# Patient Record
Sex: Female | Born: 1995 | Hispanic: Yes | Marital: Married | Smoking: Former smoker
Health system: Southern US, Community
[De-identification: ages and names within clinical notes are randomized; demographics above are authoritative.]

## PROBLEM LIST (undated history)

## (undated) DIAGNOSIS — F32A Depression, unspecified: Secondary | ICD-10-CM

## (undated) DIAGNOSIS — J45909 Unspecified asthma, uncomplicated: Secondary | ICD-10-CM

## (undated) DIAGNOSIS — F329 Major depressive disorder, single episode, unspecified: Secondary | ICD-10-CM

## (undated) HISTORY — DX: Depression, unspecified: F32.A

## (undated) HISTORY — DX: Major depressive disorder, single episode, unspecified: F32.9

## (undated) HISTORY — PX: APPENDECTOMY: SHX54

## (undated) HISTORY — DX: Unspecified asthma, uncomplicated: J45.909

---
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
## 2017-06-26 ENCOUNTER — Emergency Department
Admission: EM | Admit: 2017-06-26 | Discharge: 2017-06-27 | Disposition: A | Discharge status: Home / Self Care | Payer: Self-pay | Attending: Emergency Medicine | Admitting: Emergency Medicine

## 2017-06-26 DIAGNOSIS — E876 Hypokalemia: Secondary | ICD-10-CM

## 2017-06-26 DIAGNOSIS — O209 Hemorrhage in early pregnancy, unspecified: Secondary | ICD-10-CM | POA: Insufficient documentation

## 2017-06-26 DIAGNOSIS — Z3490 Encounter for supervision of normal pregnancy, unspecified, unspecified trimester: Secondary | ICD-10-CM

## 2017-06-26 DIAGNOSIS — O99281 Endocrine, nutritional and metabolic diseases complicating pregnancy, first trimester: Secondary | ICD-10-CM | POA: Insufficient documentation

## 2017-06-26 DIAGNOSIS — O200 Threatened abortion: Secondary | ICD-10-CM | POA: Insufficient documentation

## 2017-06-26 DIAGNOSIS — Z79899 Other long term (current) drug therapy: Secondary | ICD-10-CM | POA: Insufficient documentation

## 2017-06-26 DIAGNOSIS — Z3A01 Less than 8 weeks gestation of pregnancy: Secondary | ICD-10-CM | POA: Insufficient documentation

## 2017-06-26 LAB — URINALYSIS, ROUTINE W REFLEX MICROSCOPIC
BILIRUBIN URINE: NEGATIVE
GLUCOSE, UA: NEGATIVE mg/dL
KETONES UR: NEGATIVE mg/dL
LEUKOCYTES UA: NEGATIVE
Nitrite: NEGATIVE
PH: 5 (ref 5.0–8.0)
PROTEIN: 30 mg/dL — AB
Specific Gravity, Urine: 1.028 (ref 1.005–1.030)

## 2017-06-26 LAB — BASIC METABOLIC PANEL
Anion gap: 9 (ref 5–15)
BUN: 10 mg/dL (ref 6–20)
CALCIUM: 9.3 mg/dL (ref 8.9–10.3)
CHLORIDE: 103 mmol/L (ref 101–111)
CO2: 22 mmol/L (ref 22–32)
CREATININE: 0.58 mg/dL (ref 0.44–1.00)
GFR calc non Af Amer: >60 mL/min (ref >60)
Glucose, Bld: 107 mg/dL — ABNORMAL HIGH (ref 65–99)
Potassium: 3.1 mmol/L — ABNORMAL LOW (ref 3.5–5.1)
SODIUM: 134 mmol/L — AB (ref 135–145)

## 2017-06-26 LAB — CBC WITH DIFFERENTIAL/PLATELET
BASOS ABS: 0 10*3/uL (ref 0–0.1)
Basophils Relative: 1 %
EOS ABS: 0 10*3/uL (ref 0–0.7)
EOS PCT: 1 %
HEMATOCRIT: 37.3 % (ref 35.0–47.0)
Hemoglobin: 12.3 g/dL (ref 12.0–16.0)
Lymphocytes Relative: 33 %
Lymphs Abs: 1.7 10*3/uL (ref 1.0–3.6)
MCH: 29.1 pg (ref 26.0–34.0)
MCHC: 33.1 g/dL (ref 32.0–36.0)
MCV: 87.8 fL (ref 80.0–100.0)
MONO ABS: 0.4 10*3/uL (ref 0.2–0.9)
MONOS PCT: 7 %
Neutro Abs: 3 10*3/uL (ref 1.4–6.5)
Neutrophils Relative %: 58 %
PLATELETS: 232 10*3/uL (ref 150–440)
RBC: 4.24 MIL/uL (ref 3.80–5.20)
RDW: 15 % — AB (ref 11.5–14.5)
WBC: 5.1 10*3/uL (ref 3.6–11.0)

## 2017-06-26 LAB — HCG, QUANTITATIVE, PREGNANCY: HCG, BETA CHAIN, QUANT, S: 24875 m[IU]/mL — AB (ref <5)

## 2017-06-26 LAB — ABO/RH: ABO/RH(D): O POS

## 2017-06-26 LAB — PREGNANCY, URINE: PREG TEST UR: POSITIVE — AB

## 2017-06-26 MED ORDER — SODIUM CHLORIDE 0.9 % IV BOLUS (SEPSIS): 500.0000 mL | INTRAVENOUS | Status: DC

## 2017-06-26 NOTE — ED Triage Notes (Signed)
Patient c/o vaginal bleeding during pregnancy, lower left abdominal pain, and syncope.  Patient reports hx of 1 previous miscarriage. Patient is tearful during triage.

## 2017-06-26 NOTE — ED Provider Notes (Signed)
[facility] Emergency Department Provider Note  ____________________________________________   First MD Initiated Contact with Patient 06/26/17 2325     (approximate)  I have reviewed the triage vital signs and the nursing notes.   HISTORY  Chief Complaint Vaginal Bleeding and Loss of Consciousness    HPI Felicia Scott is a 21 y.o. female G2P0 (1 prior spontaneous abortion about 3 years ago) at about [redacted] weeks gestation by LMP who presents for evaluation of vaginal bleeding today with some generalized but primarily left sided abdominal pain and one episode of syncope while she was at work.  She says that for at least a week she has been having a light vaginal spotting but did not think anything of it.  She did not have anything to eat or much to drink today at work so she also was particularly concerned when she had her syncopal episode and was sent home.  However after she was home she went to the bathroom and there was a larger amount of blood than usual with what she believes was a clot.  She is also going through a lot of social stress and the increased amount of blood scared her so she came into the emergency department for evaluation.  She does not feel lightheaded or dizzy at this time and she denies fever/chills, chest pain, shortness of breath, nausea, vomiting, and dysuria.  Overall she describes her symptoms as severe but she is not actively bleeding now, early she does not believe she is doing so, and her abdominal pain comes and goes but is relatively mild and after the initial description of left lower quadrant abdominal pain she states is actually up under her ribs.  Nothing in particular makes her symptoms better nor worse.  History reviewed. No pertinent past medical history.  Patient Active Problem List   Diagnosis Date Noted  . MDD (major depressive disorder), single episode, severe (HCC) 07/01/2015  . Depression 06/28/2015    Past Surgical  History:  Procedure Laterality Date  . APPENDECTOMY      Prior to Admission medications   Medication Sig Start Date End Date Taking? Authorizing Provider  potassium chloride SA (KLOR-CON M20) 20 MEQ tablet Take 1 tablet (20 mEq total) by mouth daily. 06/27/17   Loleta Rose, MD  sertraline (ZOLOFT) 50 MG tablet Take 1 tablet (50 mg total) by mouth daily. 07/04/15   Truman Hayward, FNP    Allergies Patient has no known allergies.  No family history on file.  Social History Social History   Tobacco Use  . Smoking status: Never Smoker  . Smokeless tobacco: Never Used  Substance Use Topics  . Alcohol use: Yes  . Drug use: Yes    Types: Marijuana    Review of Systems Constitutional: No fever/chills Cardiovascular: Denies chest pain. Respiratory: Denies shortness of breath. Gastrointestinal: Vague generalized abdominal pain, worse in the left lower quadrant.  No nausea, no vomiting.  No diarrhea.  No constipation. Genitourinary: Vaginal bleeding during early pregnancy, light spotting for about a week with slightly heavier bleeding today Musculoskeletal: Negative for neck pain.  Negative for back pain. Integumentary: Negative for rash. Neurological: Negative for headaches, focal weakness or numbness.   ____________________________________________   PHYSICAL EXAM:  VITAL SIGNS: ED Triage Vitals  Enc Vitals Group     BP 06/26/17 2254 (!) 142/80     Pulse Rate 06/26/17 2254 (!) 104     Resp 06/26/17 2254 19     Temp  06/26/17 2254 98.3 F (36.8 C)     Temp Source 06/26/17 2254 Oral     SpO2 06/26/17 2254 100 %     Weight 06/26/17 2255 78.9 kg (174 lb)     Height --      Head Circumference --      Peak Flow --      Pain Score 06/26/17 2254 8     Pain Loc --      Pain Edu? --      Excl. in GC? --     Constitutional: Alert and oriented. Well appearing and in no acute distress except for being slightly tearful and upset Eyes: Conjunctivae are normal.    Cardiovascular: Normal rate, regular rhythm. Good peripheral circulation. Grossly normal heart sounds. Respiratory: Normal respiratory effort.  No retractions. Lungs CTAB. Gastrointestinal: Soft with some mild tenderness to palpation of the suprapubic region and left lower quadrant but no peritonitis and no rebound. Genitourinary: deferred - no compelling indication for pelvic exam tonight given single IUP with no gross abnormalities on U/S and no persistent bleeding Musculoskeletal: No lower extremity tenderness nor edema. No gross deformities of extremities. Neurologic:  Normal speech and language. No gross focal neurologic deficits are appreciated.  Skin:  Skin is warm, dry and intact. No rash noted. Psychiatric: Mood and affect are normal under the circumstances, slightly tearful but generally appropriate  ____________________________________________   LABS (all labs ordered are listed, but only abnormal results are displayed)  Labs Reviewed  URINALYSIS, ROUTINE W REFLEX MICROSCOPIC - Abnormal; Notable for the following components:      Result Value   Color, Urine YELLOW (*)    APPearance HAZY (*)    Hgb urine dipstick LARGE (*)    Protein, ur 30 (*)    Bacteria, UA RARE (*)    Squamous Epithelial / LPF 0-5 (*)    All other components within normal limits  CBC WITH DIFFERENTIAL/PLATELET - Abnormal; Notable for the following components:   RDW 15.0 (*)    All other components within normal limits  BASIC METABOLIC PANEL - Abnormal; Notable for the following components:   Sodium 134 (*)    Potassium 3.1 (*)    Glucose, Bld 107 (*)    All other components within normal limits  HCG, QUANTITATIVE, PREGNANCY - Abnormal; Notable for the following components:   hCG, Beta Chain, Quant, S 24,875 (*)    All other components within normal limits  PREGNANCY, URINE - Abnormal; Notable for the following components:   Preg Test, Ur POSITIVE (*)    All other components within normal limits   ABO/RH   ____________________________________________  EKG  ED ECG REPORT I, Loleta Roseory Tykisha Areola, the attending physician, personally viewed and interpreted this ECG.  Date: 06/26/2017 EKG Time: 22:44 Rate: 94 Rhythm: normal sinus rhythm with sinus arrhythmia QRS Axis: normal Intervals: normal ST/T Wave abnormalities: normal Narrative Interpretation: no evidence of acute ischemia  ____________________________________________  RADIOLOGY   [place] Ob Comp < 14 Wks  Result Date: 06/27/2017 CLINICAL DATA:  Vaginal bleeding, cramping. Gestational age by last menstrual period 6 weeks. EXAM: OBSTETRIC <14 WK US AND TRANSVAGINAL OB US TECHNIQUE: Both transabdominal and transvaginal ultrasound examinations were performed for complete evaluation of the gestation as well as the maternal uterus, adnexal regions, and pelvic cul-de-sac. Transvaginal technique was performed to assess early pregnancy. COMPARISON:  None. FINDINGS: Intrauterine gestational sac: Present Yolk sac:  Present Embryo:  Present Cardiac Activity: Present Heart Rate: 87  bpm CRL:  3  mm   6 w   0 d                  [facility] Subchorionic hemorrhage:  None visualized. Maternal uterus/adnexae: Normal appearance of the adnexae. Trace free fluid in the pelvis. IMPRESSION: Single live intrauterine pregnancy, gestational age by ultrasound 6 weeks. No immediate complication. Electronically Signed   By: Awilda Metroourtnay  Bloomer M.D.   On: 06/27/2017 01:24   [place] Ob Transvaginal  Result Date: 06/27/2017 CLINICAL DATA:  Vaginal bleeding, cramping. Gestational age by last menstrual period 6 weeks. EXAM: OBSTETRIC <14 WK US AND TRANSVAGINAL OB US TECHNIQUE: Both transabdominal and transvaginal ultrasound examinations were performed for complete evaluation of the gestation as well as the maternal uterus, adnexal regions, and pelvic cul-de-sac. Transvaginal technique was performed to assess early pregnancy. COMPARISON:  None. FINDINGS: Intrauterine  gestational sac: Present Yolk sac:  Present Embryo:  Present Cardiac Activity: Present Heart Rate: 87  bpm CRL:  3  mm   6 w   0 d                  US EDC: February 20, 2018 Subchorionic hemorrhage:  None visualized. Maternal uterus/adnexae: Normal appearance of the adnexae. Trace free fluid in the pelvis. IMPRESSION: Single live intrauterine pregnancy, gestational age by ultrasound 6 weeks. No immediate complication. Electronically Signed   By: Awilda Metroourtnay  Bloomer M.D.   On: 06/27/2017 01:24    ____________________________________________   PROCEDURES  Critical Care performed: No   Procedure(s) performed:   Procedures   ____________________________________________   INITIAL IMPRESSION / ASSESSMENT AND PLAN / ED COURSE  As part of my medical decision making, I reviewed the following data within the electronic MEDICAL RECORD NUMBER Nursing notes reviewed and incorporated, Labs reviewed  and EKG interpreted     Differential diagnosis includes, but is not limited to, threatened miscarriage, incomplete miscarriage, normal bleeding from an early trimester pregnancy, ectopic pregnancy, , blighted ovum, vaginal/cervical trauma, subchorionic hemorrhage/hematoma, etc.  Labs pending.  U-preg positive, discussed with ultrasound, they will proceed.  About [redacted] weeks pregnant by dates.     Clinical Course as of Jun 27 199  Fri Jun 27, 2017  0013 HCG, Newman NickelsBeta Chain, Quant, S: (!) [phone] [CF]  [id number] Reassuring U/S with 6-wk single IUP, no acute abnormalities.  Will update patient. US OB Comp < 14 Wks [CF]  0153 Oh positive blood, no indication for RhoGam.  Reassuring ultrasound, I had my usual and customary threatened miscarriage discussion with the patient and encourage close outpatient follow-up.  Low potassium, I will provide a supplement tonight and I encouraged good oral intake at home.  No indication for IV fluids at this time.  I answered any questions that the patient had and [facility] and actually already has an appointment.  I gave my usual and customary return precautions.  [CF]    Clinical Course User Index [CF] Loleta RoseForbach, Janelly Switalski, MD    ____________________________________________  FINAL CLINICAL IMPRESSION(S) / ED DIAGNOSES  Final diagnoses:  Threatened miscarriage  Pregnancy at early stage  Hypokalemia     MEDICATIONS GIVEN DURING THIS VISIT:  Medications  potassium chloride SA (K-DUR,KLOR-CON) CR tablet 40 mEq (not administered)     ED Discharge Orders        Ordered    potassium chloride SA (KLOR-CON M20) 20 MEQ tablet  Daily     06/27/17 0156       Note:  This  document was prepared using Conservation officer, historic buildings and may include unintentional dictation errors.    Loleta Rose, MD 06/27/17 [phone]

## 2017-06-27 ENCOUNTER — Other Ambulatory Visit: Payer: Self-pay

## 2017-06-27 ENCOUNTER — Emergency Department: Payer: Self-pay

## 2017-06-27 MED ORDER — POTASSIUM CHLORIDE CRYS ER 20 MEQ PO TBCR
40.0000 meq | EXTENDED_RELEASE_TABLET | Freq: Once | ORAL | Status: AC
  Administered 2017-06-27: 40 meq via ORAL
  Filled 2017-06-27: qty 2

## 2017-06-27 MED ORDER — POTASSIUM CHLORIDE CRYS ER 20 MEQ PO TBCR: 20.0000 meq | EXTENDED_RELEASE_TABLET | Freq: Every day | ORAL | 0 refills | Status: DC

## 2017-06-27 NOTE — Discharge Instructions (Signed)
You have been seen in the Emergency Department (ED) for vaginal bleeding during pregnancy, which is called a ?threatened abortion?.  Fortunately, your evaluation was reassuring, and the ultrasound showed a normal pregnancy.  Please start taking prenatal vitamins (over the counter) if you are not already doing so.  As a result of your blood type, you did not receive an injection of medication called Rhogam - please let your OB/Gyn know. ° °Please follow up as recommended above. ° °If you develop any other symptoms that concern you (including, but not limited to, persistent vomiting, worsening bleeding, abdominal or pelvic pain, or fever greater than 101), please return immediately to the Emergency Department. ° °

## 2017-06-27 NOTE — ED Notes (Signed)
Patient transported to Ultrasound 

## 2017-06-27 NOTE — ED Notes (Signed)

## 2017-06-27 NOTE — ED Notes (Signed)
ED Provider at bedside. 

## 2018-03-29 ENCOUNTER — Emergency Department: Payer: Medicaid Other

## 2018-03-29 ENCOUNTER — Emergency Department
Admission: EM | Admit: 2018-03-29 | Discharge: 2018-03-29 | Disposition: A | Discharge status: Home / Self Care | Payer: Medicaid Other | Attending: Emergency Medicine | Admitting: Emergency Medicine

## 2018-03-29 ENCOUNTER — Encounter: Payer: Self-pay | Admitting: Emergency Medicine

## 2018-03-29 DIAGNOSIS — N939 Abnormal uterine and vaginal bleeding, unspecified: Secondary | ICD-10-CM | POA: Diagnosis present

## 2018-03-29 DIAGNOSIS — Z3A01 Less than 8 weeks gestation of pregnancy: Secondary | ICD-10-CM | POA: Insufficient documentation

## 2018-03-29 DIAGNOSIS — Z79899 Other long term (current) drug therapy: Secondary | ICD-10-CM | POA: Diagnosis not present

## 2018-03-29 DIAGNOSIS — O26899 Other specified pregnancy related conditions, unspecified trimester: Secondary | ICD-10-CM

## 2018-03-29 DIAGNOSIS — O200 Threatened abortion: Secondary | ICD-10-CM | POA: Diagnosis not present

## 2018-03-29 DIAGNOSIS — R109 Unspecified abdominal pain: Secondary | ICD-10-CM

## 2018-03-29 LAB — WET PREP, GENITAL
Clue Cells Wet Prep HPF POC: NONE SEEN
Sperm: NONE SEEN
Trich, Wet Prep: NONE SEEN
WBC WET PREP: NONE SEEN
YEAST WET PREP: NONE SEEN

## 2018-03-29 LAB — CBC WITH DIFFERENTIAL/PLATELET
BASOS PCT: 1 %
Basophils Absolute: 0.1 10*3/uL (ref 0–0.1)
EOS ABS: 0 10*3/uL (ref 0–0.7)
EOS PCT: 0 %
HEMATOCRIT: 37.1 % (ref 35.0–47.0)
Hemoglobin: 12.7 g/dL (ref 12.0–16.0)
Lymphocytes Relative: 19 %
Lymphs Abs: 1.2 10*3/uL (ref 1.0–3.6)
MCH: 29.8 pg (ref 26.0–34.0)
MCHC: 34.3 g/dL (ref 32.0–36.0)
MCV: 87.1 fL (ref 80.0–100.0)
MONO ABS: 0.7 10*3/uL (ref 0.2–0.9)
MONOS PCT: 11 %
NEUTROS ABS: 4.6 10*3/uL (ref 1.4–6.5)
Neutrophils Relative %: 69 %
PLATELETS: 221 10*3/uL (ref 150–440)
RBC: 4.26 MIL/uL (ref 3.80–5.20)
RDW: 14.4 % (ref 11.5–14.5)
WBC: 6.7 10*3/uL (ref 3.6–11.0)

## 2018-03-29 LAB — PREGNANCY, URINE: PREG TEST UR: POSITIVE — AB

## 2018-03-29 LAB — URINALYSIS, COMPLETE (UACMP) WITH MICROSCOPIC
BACTERIA UA: NONE SEEN
BILIRUBIN URINE: NEGATIVE
Glucose, UA: NEGATIVE mg/dL
Ketones, ur: NEGATIVE mg/dL
Leukocytes, UA: NEGATIVE
NITRITE: NEGATIVE
PROTEIN: NEGATIVE mg/dL
Specific Gravity, Urine: 1.011 (ref 1.005–1.030)
pH: 5 (ref 5.0–8.0)

## 2018-03-29 LAB — COMPREHENSIVE METABOLIC PANEL
ALBUMIN: 4.5 g/dL (ref 3.5–5.0)
ALT: 12 U/L (ref 0–44)
ANION GAP: 8 (ref 5–15)
AST: 21 U/L (ref 15–41)
Alkaline Phosphatase: 68 U/L (ref 38–126)
BUN: 13 mg/dL (ref 6–20)
CHLORIDE: 105 mmol/L (ref 98–111)
CO2: 25 mmol/L (ref 22–32)
Calcium: 9.5 mg/dL (ref 8.9–10.3)
Creatinine, Ser: 0.82 mg/dL (ref 0.44–1.00)
GFR calc Af Amer: >60 mL/min (ref >60)
Glucose, Bld: 100 mg/dL — ABNORMAL HIGH (ref 70–99)
Potassium: 3.4 mmol/L — ABNORMAL LOW (ref 3.5–5.1)
Sodium: 138 mmol/L (ref 135–145)
Total Bilirubin: 0.8 mg/dL (ref 0.3–1.2)
Total Protein: 7.8 g/dL (ref 6.5–8.1)

## 2018-03-29 LAB — CHLAMYDIA/NGC RT PCR (ARMC ONLY)
Chlamydia Tr: NOT DETECTED
N GONORRHOEAE: NOT DETECTED

## 2018-03-29 LAB — POCT PREGNANCY, URINE: PREG TEST UR: POSITIVE — AB

## 2018-03-29 LAB — SAMPLE TO BLOOD BANK

## 2018-03-29 LAB — HCG, QUANTITATIVE, PREGNANCY: hCG, Beta Chain, Quant, S: 24501 m[IU]/mL — ABNORMAL HIGH (ref <5)

## 2018-03-29 MED ORDER — PROMETHAZINE HCL 25 MG/ML IJ SOLN
12.5000 mg | Freq: Once | INTRAMUSCULAR | Status: AC
Start: 2018-03-29 — End: 2018-03-29
  Administered 2018-03-29: 12.5 mg via INTRAVENOUS
  Filled 2018-03-29: qty 1

## 2018-03-29 NOTE — ED Notes (Signed)
Patient back from ultrasound

## 2018-03-29 NOTE — ED Notes (Signed)
Pt had IV 20G to right AC that was removed prior to discharge.

## 2018-03-29 NOTE — ED Notes (Signed)
Pt to ultrasound

## 2018-03-29 NOTE — Discharge Instructions (Addendum)
Anytime there is bleeding in pregnancy we have a threatened miscarriage.  You have a little bit of bleeding around the pregnancy called a subchorionic hemorrhage.  There is a very good chance that this will go away and the pregnancy will turn out normally but that is not guaranteed.  Please remember that this early in pregnancy.  If you do miscarry at something wrong with the pregnancy nothing that you were your husband did.  Please follow-up with Dr. Valentino Saxon the OB/GYN doctor.  Please call them Tuesday morning they should be able to get you in and do a repeat blood test to check the pregnancy hormone level and be able to go from there.  Please return here for worse cramping bleeding or pain.  Also return here for any fever.  He does also have them check your blood pressure was very high when you got here.

## 2018-03-29 NOTE — ED Notes (Signed)
Lab notified to run UA from urine already in the lab.

## 2018-03-29 NOTE — ED Provider Notes (Signed)
[facility] Emergency Department Provider Note   ____________________________________________   First MD Initiated Contact with Patient 03/29/18 [phone]     (approximate)  I have reviewed the triage vital signs and the nursing notes.   HISTORY  Chief Complaint Vaginal Bleeding    HP Felicia Scott is a 22 y.o. female patient reports vaginal bleeding and some cramping for about 3 days.  Stopped and she started having some spotting with clots now.  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  History reviewed. No pertinent past medical history.  Patient Active Problem List   Diagnosis Date Noted  . MDD (major depressive disorder), single episode, severe (HCC) 07/01/2015  . Depression 06/28/2015    Past Surgical History:  Procedure Laterality Date  . APPENDECTOMY      Prior to Admission medications   Medication Sig Start Date End Date Taking? Authorizing Provider  sertraline (ZOLOFT) 50 MG tablet Take 1 tablet (50 mg total) by mouth daily. 07/04/15  Yes Starkes, Juel Burrow, FNP  potassium chloride SA (KLOR-CON M20) 20 MEQ tablet Take 1 tablet (20 mEq total) by mouth daily. Patient not taking: Reported on 03/29/2018 06/27/17   Loleta Rose, MD    Allergies Patient has no known allergies.  History reviewed. No pertinent family history.  Social History Social History   Tobacco Use  . Smoking status: Never Smoker  . Smokeless tobacco: Never Used  Substance Use Topics  . Alcohol use: Yes  . Drug use: Yes    Types: Marijuana    Review of Systems  Constitutional: No fever/chills Eyes: No visual changes. ENT: No sore throat. Cardiovascular: Denies chest pain. Respiratory: Denies shortness of breath. Gastrointestinal: Some crampy lower abdominal pain.  No nausea, no vomiting.  No diarrhea.  No constipation. Genitourinary: Negative for dysuria. Musculoskeletal: Negative for back pain. Skin: Negative for rash. Neurological: Negative for headaches,  focal weakness ____________________________________________   PHYSICAL EXAM:  VITAL SIGNS: ED Triage Vitals [03/29/18 0907]  Enc Vitals Group     BP (!) 179/75     Pulse Rate 79     Resp 12     Temp 99 F (37.2 C)     Temp Source Oral     SpO2 100 %     Weight 177 lb (80.3 kg)     Height      Head Circumference      Peak Flow      Pain Score 0     Pain Loc      Pain Edu?      Excl. in GC?     Constitutional: Alert and oriented. Well appearing and in no acute distress. Eyes: Conjunctivae are normal.  Head: Atraumatic. Nose: No congestion/rhinnorhea. Mouth/Throat: Mucous membranes are moist.  Oropharynx non-erythematous. Neck: No stridor.   Cardiovascular: Normal rate, regular rhythm. Grossly normal heart sounds.  Good peripheral circulation. Respiratory: Normal respiratory effort.  No retractions. Lungs CTAB. Gastrointestinal: Soft and nontender. No distention. No abdominal bruits. No CVA tenderness. Genitourinary: Normal perineum and vagina there is a small amount of scant yellowish discharge.  The cervix is closed there is no cervical motion tenderness or adnexal tenderness or masses. Musculoskeletal: No lower extremity tenderness nor edema. Neurologic:  Normal speech and language. No gross focal neurologic deficits are appreciated.  Skin:  Skin is warm, dry and intact. No rash noted. Psychiatric: Mood and affect are normal. Speech and behavior are normal.  ____________________________________________   LABS (all labs ordered are listed,  but only abnormal results are displayed)  Labs Reviewed  PREGNANCY, URINE - Abnormal; Notable for the following components:      Result Value   Preg Test, Ur POSITIVE (*)    All other components within normal limits  COMPREHENSIVE METABOLIC PANEL - Abnormal; Notable for the following components:   Potassium 3.4 (*)    Glucose, Bld 100 (*)    All other components within normal limits  HCG, QUANTITATIVE, PREGNANCY - Abnormal;  Notable for the following components:   hCG, Beta Chain, Quant, S 24,501 (*)    All other components within normal limits  POCT PREGNANCY, URINE - Abnormal; Notable for the following components:   Preg Test, Ur POSITIVE (*)    All other components within normal limits  CBC WITH DIFFERENTIAL/PLATELET  POC URINE PREG, ED  SAMPLE TO BLOOD BANK   ____________________________________________  EKG  _____________________________________  RADIOLOGY  ED MD interpretation: Ultrasound shows a small 6-week pregnancy subchorionic hemorrhage  Official radiology report(s): US Ob Less Than 14 Weeks With Ob Transvaginal  Result Date: 03/29/2018 CLINICAL DATA:  Vaginal bleeding. Abdominal cramping. Symptoms for 3 days. Quantitative beta HCG 24501. By LMP patient is 2 weeks 6 days. By the patient's LMP, EDC is 12/14/2018. LMP 03/09/2018. EXAM: OBSTETRIC <14 WK [place] AND TRANSVAGINAL OB US TECHNIQUE: Both transabdominal and transvaginal ultrasound examinations were performed for complete evaluation of the gestation as well as the maternal uterus, adnexal regions, and pelvic cul-de-sac. Transvaginal technique was performed to assess early pregnancy. COMPARISON:  None. FINDINGS: Intrauterine gestational sac: None Yolk sac:  Visualized. Embryo:  Visualized. Cardiac Activity: Visualized. Heart Rate: 114 bpm CRL:  3.5 mm   6 w   0 d                  [place] EDC: 11/22/2018 Subchorionic hemorrhage: Small subchorionic hemorrhage is present. Hemorrhage measures 0.9 x 0.5 x 1.0 centimeters. Maternal uterus/adnexae: Ovaries are normal in appearance. No free pelvic fluid. IMPRESSION: 1. Single living intrauterine embryo. 2. Dating by today's exam differs compared with clinical dating. By today's exam, EDC is 11/22/2018. 3. Small subchorionic hemorrhage. Electronically Signed   By: Norva Pavlov M.D.   On: 03/29/2018 12:37    ____________________________________________   PROCEDURES  Procedure(s) performed:   Procedures  Critical Care performed:   ____________________________________________   INITIAL IMPRESSION / ASSESSMENT AND PLAN / ED COURSE  Discussed with patient husband that this is a threatened miscarriage they need to follow-up with OB/GYN in 2 days for repeat evaluation return for any worse pain fever bleeding or any problems.        ____________________________________________   FINAL CLINICAL IMPRESSION(S) / ED DIAGNOSES  Final diagnoses:  Vaginal bleeding  Abdominal cramping affecting pregnancy     ED Discharge Orders    None       Note:  This document was prepared using Dragon voice recognition software and may include unintentional dictation errors.    Arnaldo Natal, MD 03/29/18 1524

## 2018-03-29 NOTE — ED Triage Notes (Signed)
Pt presents c/o vaginal bleeding and abd cramping x3 days. Reports brown spotting with some clots. Also reports + pregnancy test at home. S/p miscarriage in June at apx 3 months OB per pt report. Last menstrual cycyle 03/13/18 per pt report.

## 2018-04-01 ENCOUNTER — Encounter: Payer: Self-pay | Admitting: Emergency Medicine

## 2018-04-01 ENCOUNTER — Emergency Department
Admission: EM | Admit: 2018-04-01 | Discharge: 2018-04-01 | Disposition: A | Discharge status: Home / Self Care | Payer: Medicaid Other | Attending: Emergency Medicine | Admitting: Emergency Medicine

## 2018-04-01 ENCOUNTER — Other Ambulatory Visit: Payer: Self-pay

## 2018-04-01 DIAGNOSIS — Z79899 Other long term (current) drug therapy: Secondary | ICD-10-CM | POA: Insufficient documentation

## 2018-04-01 DIAGNOSIS — O9989 Other specified diseases and conditions complicating pregnancy, childbirth and the puerperium: Secondary | ICD-10-CM | POA: Diagnosis not present

## 2018-04-01 DIAGNOSIS — Z3A01 Less than 8 weeks gestation of pregnancy: Secondary | ICD-10-CM | POA: Insufficient documentation

## 2018-04-01 DIAGNOSIS — O4690 Antepartum hemorrhage, unspecified, unspecified trimester: Secondary | ICD-10-CM

## 2018-04-01 DIAGNOSIS — O209 Hemorrhage in early pregnancy, unspecified: Secondary | ICD-10-CM | POA: Diagnosis not present

## 2018-04-01 DIAGNOSIS — R1030 Lower abdominal pain, unspecified: Secondary | ICD-10-CM | POA: Insufficient documentation

## 2018-04-01 LAB — HCG, QUANTITATIVE, PREGNANCY: HCG, BETA CHAIN, QUANT, S: 51186 m[IU]/mL — AB (ref <5)

## 2018-04-01 MED ORDER — ACETAMINOPHEN 500 MG PO TABS
1000.0000 mg | ORAL_TABLET | Freq: Once | ORAL | Status: AC
Start: 2018-04-01 — End: 2018-04-01
  Administered 2018-04-01: 1000 mg via ORAL
  Filled 2018-04-01: qty 2

## 2018-04-01 NOTE — Discharge Instructions (Signed)
Follow up with ObGYN in 1 week for repeat ultrasound. As I explained to you this bleeding could be a miscarriage or just continuation of the hemorrhage seen on ultrasound done 3 days ago.  If it is a miscarriage you should expect a heavier than normal period. Avoid sexual intercourse or anything in the vagina until the bleeding has resolved.

## 2018-04-01 NOTE — ED Notes (Signed)
ED Provider at bedside. 

## 2018-04-01 NOTE — ED Notes (Signed)
Pt states she is 6 weeks & 3 days pregnant. Began bleeding this morning. Has intermittent strong mid/lower abdominal cramping. Pt appears uncomfortable in the bed.  Significant other at bedside.

## 2018-04-01 NOTE — ED Provider Notes (Signed)
[facility] Emergency Department Provider Note  ____________________________________________  Time seen: Approximately 1:43 PM  I have reviewed the triage vital signs and the nursing notes.   HISTORY  Chief Complaint Vaginal Bleeding and Abdominal Cramping   HPI Felicia Scott is a 22 y.o. female currently [redacted] weeks gestational age presents for evaluation of vaginal bleeding.  Patient was seen here 3 days ago for vaginal bleeding and was diagnosed with a subchorionic hemorrhage.  She reports that the bleeding stopped for 1 to 2 days and restarted again this morning.  She reports that it looks like a menstrual period.  She is complaining of severe suprapubic cramping that has been constant since this morning.  She has not taken anything for at home.  She denies dizziness, chest pain or shortness of breath. Patient reports that the bleeding re-started after having sexual intercourse yesterday   History reviewed. No pertinent past medical history.  Patient Active Problem List   Diagnosis Date Noted  . MDD (major depressive disorder), single episode, severe (HCC) 07/01/2015  . Depression 06/28/2015    Past Surgical History:  Procedure Laterality Date  . APPENDECTOMY      Prior to Admission medications   Medication Sig Start Date End Date Taking? Authorizing Provider  potassium chloride SA (KLOR-CON M20) 20 MEQ tablet Take 1 tablet (20 mEq total) by mouth daily. Patient not taking: Reported on 03/29/2018 06/27/17   Loleta Rose, MD  sertraline (ZOLOFT) 50 MG tablet Take 1 tablet (50 mg total) by mouth daily. Patient not taking: Reported on 04/01/2018 07/04/15   Truman Hayward, FNP    Allergies Patient has no known allergies.  History reviewed. No pertinent family history.  Social History Social History   Tobacco Use  . Smoking status: Never Smoker  . Smokeless tobacco: Never Used  Substance Use Topics  . Alcohol use: Yes  . Drug use: Yes   Types: Marijuana    Review of Systems  Constitutional: Negative for fever. Eyes: Negative for visual changes. ENT: Negative for sore throat. Neck: No neck pain  Cardiovascular: Negative for chest pain. Respiratory: Negative for shortness of breath. Gastrointestinal: + Suprapubic cramping. No vomiting or diarrhea. Genitourinary: Negative for dysuria. + Vaginal bleed Musculoskeletal: Negative for back pain. Skin: Negative for rash. Neurological: Negative for headaches, weakness or numbness. Psych: No SI or HI  ____________________________________________   PHYSICAL EXAM:  VITAL SIGNS: ED Triage Vitals  Enc Vitals Group     BP 04/01/18 1121 125/80     Pulse Rate 04/01/18 1121 79     Resp 04/01/18 1121 18     Temp 04/01/18 1121 98.2 F (36.8 C)     Temp Source 04/01/18 1121 Oral     SpO2 04/01/18 1121 98 %     Weight --      Height --      Head Circumference --      Peak Flow --      Pain Score 04/01/18 1119 5     Pain Loc --      Pain Edu? --      Excl. in GC? --     Constitutional: Alert and oriented. Well appearing and in no apparent distress. HEENT:      Head: Normocephalic and atraumatic.         Eyes: Conjunctivae are normal. Sclera is non-icteric.       Mouth/Throat: Mucous membranes are moist.       Neck: Supple with no signs  of meningismus. Cardiovascular: Regular rate and rhythm. No murmurs, gallops, or rubs. 2+ symmetrical distal pulses are present in all extremities. No JVD. Respiratory: Normal respiratory effort. Lungs are clear to auscultation bilaterally. No wheezes, crackles, or rhonchi.  Gastrointestinal: Soft, non tender, and non distended with positive bowel sounds. No rebound or guarding. Genitourinary: No CVA tenderness. Musculoskeletal: Nontender with normal range of motion in all extremities. No edema, cyanosis, or erythema of extremities. Neurologic: Normal speech and language. Face is symmetric. Moving all extremities. No gross focal  neurologic deficits are appreciated. Skin: Skin is warm, dry and intact. No rash noted. Psychiatric: Mood and affect are normal. Speech and behavior are normal.  ____________________________________________   LABS (all labs ordered are listed, but only abnormal results are displayed)  Labs Reviewed  HCG, QUANTITATIVE, PREGNANCY - Abnormal; Notable for the following components:      Result Value   hCG, Beta Chain, Quant, S 51,186 (*)    All other components within normal limits   ____________________________________________  EKG  none  ____________________________________________  RADIOLOGY  none  ____________________________________________   PROCEDURES  Procedure(s) performed: None Procedures Critical Care performed:  None ____________________________________________   INITIAL IMPRESSION / ASSESSMENT AND PLAN / ED COURSE  22 y.o. female currently [redacted] weeks gestational age presents for evaluation of vaginal bleeding.  Patient was here 3 days ago with similar complaint and had an ultrasound showing a subchorionic hemorrhage.  This is most likely continuation of that hemorrhage versus miscarriage.  A bedside ultrasound shows a gestational sac with no fetal pole seen although I would not expect to see it on a 6-week transabdominal ultrasound.  She is hemodynamically stable.  Blood type is Rh+ with no indication for RhoGam.  Discussed pelvic rest, close follow-up with OB/GYN on Monday for repeat ultrasound.  Her beta quant is trending up. Discussed signs and symptoms of acute blood loss anemia and recommended return if these develop.       As part of my medical decision making, I reviewed the following data within the electronic MEDICAL RECORD NUMBER Nursing notes reviewed and incorporated, Labs reviewed , Old chart reviewed, Notes from prior ED visits and [place] Controlled Substance Database    Pertinent labs & imaging results that were available during my care of the patient were  reviewed by me and considered in my medical decision making (see chart for details).    ____________________________________________   FINAL CLINICAL IMPRESSION(S) / ED DIAGNOSES  Final diagnoses:  Vaginal bleeding in pregnancy      NEW MEDICATIONS STARTED DURING THIS VISIT:  ED Discharge Orders    None       Note:  This document was prepared using Dragon voice recognition software and may include unintentional dictation errors.    Don Perking, [place], MD 04/01/18 [phone]

## 2018-04-01 NOTE — ED Notes (Signed)
Pt ambulatory upon discharge; declined wheel chair. Verbalized understanding of discharge instructions and follow-up care. VSS. Skin warm and dry. A&O x4.  

## 2018-04-01 NOTE — ED Triage Notes (Addendum)
Pt arrived via POV with reports of vaginal bleeding that started this morning. Pt states she noticed the bleeding when she wiped. Pt is [redacted] weeks pregnant, dx with subchoroinic hemorrhage on Sunday in the ED here.  Pt also found out her husband was dx with genital herpes as well.   Pt has not followed up with OB-GYN yet.

## 2018-04-06 ENCOUNTER — Encounter: Payer: Self-pay | Admitting: Obstetrics and Gynecology

## 2018-04-06 ENCOUNTER — Other Ambulatory Visit (INDEPENDENT_AMBULATORY_CARE_PROVIDER_SITE_OTHER): Payer: Self-pay

## 2018-04-06 ENCOUNTER — Other Ambulatory Visit: Payer: Self-pay | Admitting: Obstetrics and Gynecology

## 2018-04-06 ENCOUNTER — Ambulatory Visit (INDEPENDENT_AMBULATORY_CARE_PROVIDER_SITE_OTHER): Payer: Self-pay | Admitting: Obstetrics and Gynecology

## 2018-04-06 VITALS — BP 99/66 | HR 75 | Ht 63.0 in | Wt 172.4 lb

## 2018-04-06 DIAGNOSIS — N926 Irregular menstruation, unspecified: Secondary | ICD-10-CM

## 2018-04-06 DIAGNOSIS — O468X1 Other antepartum hemorrhage, first trimester: Secondary | ICD-10-CM

## 2018-04-06 DIAGNOSIS — R102 Pelvic and perineal pain: Secondary | ICD-10-CM

## 2018-04-06 DIAGNOSIS — O3680X0 Pregnancy with inconclusive fetal viability, not applicable or unspecified: Secondary | ICD-10-CM

## 2018-04-06 DIAGNOSIS — Z3A01 Less than 8 weeks gestation of pregnancy: Secondary | ICD-10-CM

## 2018-04-06 DIAGNOSIS — O09291 Supervision of pregnancy with other poor reproductive or obstetric history, first trimester: Secondary | ICD-10-CM

## 2018-04-06 DIAGNOSIS — O200 Threatened abortion: Secondary | ICD-10-CM

## 2018-04-06 DIAGNOSIS — O418X10 Other specified disorders of amniotic fluid and membranes, first trimester, not applicable or unspecified: Secondary | ICD-10-CM

## 2018-04-06 DIAGNOSIS — O09299 Supervision of pregnancy with other poor reproductive or obstetric history, unspecified trimester: Secondary | ICD-10-CM

## 2018-04-06 NOTE — Patient Instructions (Signed)
Threatened Miscarriage °A threatened miscarriage is when you have vaginal bleeding during your first 20 weeks of pregnancy but the pregnancy has not ended. Your doctor will do tests to make sure you are still pregnant. The cause of the bleeding may not be known. This condition does not mean your pregnancy will end. It does increase the risk of it ending (complete miscarriage). °Follow these instructions at home: °· Make sure you keep all your doctor visits for prenatal care. °· Get plenty of rest. °· Do not have sex or use tampons if you have vaginal bleeding. °· Do not douche. °· Do not smoke or use drugs. °· Do not drink alcohol. °· Avoid caffeine. °Contact a doctor if: °· You have light bleeding from your vagina. °· You have belly pain or cramping. °· You have a fever. °Get help right away if: °· You have heavy bleeding from your vagina. °· You have clots of blood coming from your vagina. °· You have bad pain or cramps in your low back or belly. °· You have fever, chills, and bad belly pain. °This information is not intended to replace advice given to you by your health care provider. Make sure you discuss any questions you have with your health care provider. °Document Released: 06/27/2008 Document Revised: 12/21/2015 Document Reviewed: 05/11/2013 °Elsevier Interactive Patient Education © 2018 [employer] ° °

## 2018-04-06 NOTE — Progress Notes (Signed)
Pt is present today for a follow up after an ED visit. Pt stated that she is in a lot of pelvic pain. Pt stated paain level 6 or 7.

## 2018-04-06 NOTE — Progress Notes (Signed)
GYNECOLOGY CLINIC PROGRESS NOTE Subjective:    Felicia Scott is a 22 y.o. G27P0010 female with new pregnancy diagnosed on 03/29/2018 in the Emergency Room.  Patient was seen last week due to complaints of vaginal bleeding.  Was diagnosed with an IUP at [redacted] weeks gestation, however no fetal pole was identified at that time. Morene reports bleeding stopped after ER visit, however restarted again today. Also has been noting crampy pelvic pain for the past several hours. She is in mild acute distress. Ectopic risks: none.  Patient's last menstrual period was 03/13/2018 (approximate date).  Current EGA is 7.1 weeks by last week's ER sono, with EDD 11/21/2017, not consistent with patient's last menstrual period.   Cycle length: regular 4-5 days. Pregnancy testing: quant HCG level 04/01/2018 on 04/01/2018. Pregnancy imaging: transvaginal ultrasound done on 03/29/2018. Result (see below). Blood type: O positive. Other lab results:  Lab Results  Component Value Date   WBC 6.7 03/29/2018   HGB 12.7 03/29/2018   HCT 37.1 03/29/2018   MCV 87.1 03/29/2018   PLT 221 03/29/2018     US OB LESS THAN 14 WEEKS WITH OB TRANSVAGINAL CLINICAL DATA:  Vaginal bleeding. Abdominal cramping. Symptoms for 3 days. Quantitative beta HCG 24501. By LMP patient is 2 weeks 6 days. By the patient's LMP, EDC is 12/14/2018. LMP 03/09/2018.  EXAM: OBSTETRIC <14 WK [place] AND TRANSVAGINAL OB US  TECHNIQUE: Both transabdominal and transvaginal ultrasound examinations were performed for complete evaluation of the gestation as well as the maternal uterus, adnexal regions, and pelvic cul-de-sac. Transvaginal technique was performed to assess early pregnancy.  COMPARISON:  None.  FINDINGS: Intrauterine gestational sac: None  Yolk sac:  Visualized.  Embryo:  Visualized.  Cardiac Activity: Visualized.  Heart Rate: 114 bpm  CRL:  3.5 mm   6 w   0 d                  [place] EDC: 11/22/2018  Subchorionic hemorrhage: Small  subchorionic hemorrhage is present. Hemorrhage measures 0.9 x 0.5 x 1.0 centimeters.  Maternal uterus/adnexae: Ovaries are normal in appearance. No free pelvic fluid.  IMPRESSION: 1. Single living intrauterine embryo. 2. Dating by today's exam differs compared with clinical dating. By today's exam, EDC is 11/22/2018. 3. Small subchorionic hemorrhage.  Electronically Signed   By: Norva Pavlov M.D.   On: 03/29/2018 12:37    The following portions of the patient's history were reviewed and updated as appropriate:  She  has a past medical history of No pertinent past medical history. She  has a past surgical history that includes Appendectomy.  She has no significant past family history.  She  reports that she has quit smoking. She has never used smokeless tobacco. She reports that she drank alcohol. She reports that she has current or past drug history. Drug: Marijuana.   Current Outpatient Medications on File Prior to Visit  Medication Sig Dispense Refill  . Prenatal Vit-Fe Fumarate-FA (PRENATAL MULTIVITAMIN) TABS tablet Take 1 tablet by mouth daily at 12 noon.    . potassium chloride SA (KLOR-CON M20) 20 MEQ tablet Take 1 tablet (20 mEq total) by mouth daily. (Patient not taking: Reported on 03/29/2018) 7 tablet 0  . sertraline (ZOLOFT) 50 MG tablet Take 1 tablet (50 mg total) by mouth daily. (Patient not taking: Reported on 04/01/2018) 30 tablet 0   No current facility-administered medications on file prior to visit.    She has No Known Allergies..  Review of Systems  Pertinent items noted in HPI and remainder of comprehensive ROS otherwise negative.   Objective:     BP 99/66   Pulse 75   Ht 5\' 3"  (1.6 m)   Wt 172 lb 6.4 oz (78.2 kg)   LMP 03/13/2018 (Exact Date)   BMI 30.54 kg/m  General:   alert and no distress  Heart: regular rate and rhythm, S1, S2 normal, no murmur, click, rub or gallop  Lungs: clear to auscultation bilaterally  Abdomen: soft, non-tender, without  masses or organomegaly                 Vulva: normal              Vagina:  normal mucosa, dark brown blood in vaginal vault               Cervix: no cervical motion tenderness, no lesions and closed               Uterus: normal size              Adnexa: normal adnexa and no mass, fullness, tenderness   Imaging US OB Transvaginal ULTRASOUND REPORT  Location: [facility] Date of Service:  04/06/2018  Indications: Dating/Viability Findings:  Mason Jim intrauterine pregnancy is visualized with a CRL consistent with  7 4/[redacted] weeks gestation, giving an (U/S) EDD of 11/19/18. A clinical EDD has not been established, however, today's scan does not  agree with patient stated LMP of 03/09/18.  FHR: 155 BPM CRL measurement: 12.9 mm Yolk sac and early anatomy is normal.  Right Ovary measures 2.6 x 2.0 x 1.8 cm. It is normal in appearance. Left Ovary measures 2.8 x 2.0 x 1.7 cm. It is normal appearance. There is no obvious evidence of a corpus luteal cyst. Survey of the adnexa demonstrates no adnexal masses. There is no free peritoneal fluid in the cul de sac.  Impression: 1. 7 4/7 week Viable Singleton Intrauterine pregnancy by U/S. 2. A clinical EDD has not been established.  Recommendations: 1.Clinical correlation with the patient's History and Physical Exam. 2. Today's ultrasound should be used to establish EDD of 11/19/18.  Kari Baars, RDMS    I have reviewed this study and agree with documented findings.    Hildred Laser, MD [facility]     Assessment:    IUP at [redacted] weeks gestation Threatened abortion Subchorionic hemorrhage History of miscarriage  Plan:    Follow-up appointment in 2-3 weeks for NOB intake.  Quantitative hCG today performed today. Warning signs discussed: to call for increased bleeding, abdominal pain, light headedness, or if she has any concerns.   Does not require Rhogam.  No evidence of subchorionic hemorrhage on today's exam.       Hildred Laser, MD [facility]

## 2018-05-01 ENCOUNTER — Ambulatory Visit (INDEPENDENT_AMBULATORY_CARE_PROVIDER_SITE_OTHER): Payer: Medicaid Other | Admitting: Certified Nurse Midwife

## 2018-05-01 VITALS — BP 107/71 | HR 96 | Ht 63.0 in | Wt 168.3 lb

## 2018-05-01 DIAGNOSIS — Z3491 Encounter for supervision of normal pregnancy, unspecified, first trimester: Secondary | ICD-10-CM

## 2018-05-01 MED ORDER — PROMETHAZINE HCL 25 MG/ML IJ SOLN
25.0000 mg | Freq: Once | INTRAMUSCULAR | Status: AC
  Administered 2018-05-01: 25 mg via INTRAMUSCULAR

## 2018-05-01 NOTE — Patient Instructions (Signed)
How a Baby Grows During Pregnancy Pregnancy begins when a female's sperm enters a female's egg (fertilization). This happens in one of the tubes (fallopian tubes) that connect the ovaries to the womb (uterus). The fertilized egg is called an embryo until it reaches 10 weeks. From 10 weeks until birth, it is called a fetus. The fertilized egg moves down the fallopian tube to the uterus. Then it implants into the lining of the uterus and begins to grow. The developing fetus receives oxygen and nutrients through the pregnant woman's bloodstream and the tissues that grow (placenta) to support the fetus. The placenta is the life support system for the fetus. It provides nutrition and removes waste. Learning as much as you can about your pregnancy and how your baby is developing can help you enjoy the experience. It can also make you aware of when there might be a problem and when to ask questions. How long does a typical pregnancy last? A pregnancy usually lasts 280 days, or about 40 weeks. Pregnancy is divided into three trimesters:  First trimester: 0-13 weeks.  Second trimester: 14-27 weeks.  Third trimester: 28-40 weeks.  The day when your baby is considered ready to be born (full term) is your estimated date of delivery. How does my baby develop month by month? First month  The fertilized egg attaches to the inside of the uterus.  Some cells will form the placenta. Others will form the fetus.  The arms, legs, brain, spinal cord, lungs, and heart begin to develop.  At the end of the first month, the heart begins to beat.  Second month  The bones, inner ear, eyelids, hands, and feet form.  The genitals develop.  By the end of 8 weeks, all major organs are developing.  Third month  All of the internal organs are forming.  Teeth develop below the gums.  Bones and muscles begin to grow. The spine can flex.  The skin is transparent.  Fingernails and toenails begin to form.  Arms  and legs continue to grow longer, and hands and feet develop.  The fetus is about 3 in (7.6 cm) long.  Fourth month  The placenta is completely formed.  The external sex organs, neck, outer ear, eyebrows, eyelids, and fingernails are formed.  The fetus can hear, swallow, and move its arms and legs.  The kidneys begin to produce urine.  The skin is covered with a white waxy coating (vernix) and very fine hair (lanugo).  Fifth month  The fetus moves around more and can be felt for the first time (quickening).  The fetus starts to sleep and wake up and may begin to suck its finger.  The nails grow to the end of the fingers.  The organ in the digestive system that makes bile (gallbladder) functions and helps to digest the nutrients.  If your baby is a girl, eggs are present in her ovaries. If your baby is a boy, testicles start to move down into his scrotum.  Sixth month  The lungs are formed, but the fetus is not yet able to breathe.  The eyes open. The brain continues to develop.  Your baby has fingerprints and toe prints. Your baby's hair grows thicker.  At the end of the second trimester, the fetus is about 9 in (22.9 cm) long.  Seventh month  The fetus kicks and stretches.  The eyes are developed enough to sense changes in light.  The hands can make a grasping motion.  The  fetus responds to sound.  Eighth month  All organs and body systems are fully developed and functioning.  Bones harden and taste buds develop. The fetus may hiccup.  Certain areas of the brain are still developing. The skull remains soft.  Ninth month  The fetus gains about  lb (0.23 kg) each week.  The lungs are fully developed.  Patterns of sleep develop.  The fetus's head typically moves into a head-down position (vertex) in the uterus to prepare for birth. If the buttocks move into a vertex position instead, the baby is breech.  The fetus weighs 6-9 lbs (2.72-4.08 kg) and is  19-20 in (48.26-50.8 cm) long.  What can I do to have a healthy pregnancy and help my baby develop? Eating and Drinking  Eat a healthy diet. ? Talk with your health care provider to make sure that you are getting the nutrients that you and your baby need. ? Visit www.BuildDNA.es to learn about creating a healthy diet.  Gain a healthy amount of weight during pregnancy as advised by your health care provider. This is usually 25-35 pounds. You may need to: ? Gain more if you were underweight before getting pregnant or if you are pregnant with more than one baby. ? Gain less if you were overweight or obese when you got pregnant.  Medicines and Vitamins  Take prenatal vitamins as directed by your health care provider. These include vitamins such as folic acid, iron, calcium, and vitamin D. They are important for healthy development.  Take medicines only as directed by your health care provider. Read labels and ask a pharmacist or your health care provider whether over-the-counter medicines, supplements, and prescription drugs are safe to take during pregnancy.  Activities  Be physically active as advised by your health care provider. Ask your health care provider to recommend activities that are safe for you to do, such as walking or swimming.  Do not participate in strenuous or extreme sports.  Lifestyle  Do not drink alcohol.  Do not use any tobacco products, including cigarettes, chewing tobacco, or electronic cigarettes. If you need help quitting, ask your health care provider.  Do not use illegal drugs.  Safety  Avoid exposure to mercury, lead, or other heavy metals. Ask your health care provider about common sources of these heavy metals.  Avoid listeria infection during pregnancy. Follow these precautions: ? Do not eat soft cheeses or deli meats. ? Do not eat hot dogs unless they have been warmed up to the point of steaming, such as in the microwave oven. ? Do not  drink unpasteurized milk.  Avoid toxoplasmosis infection during pregnancy. Follow these precautions: ? Do not change your cat's litter box, if you have a cat. Ask someone else to do this for you. ? Wear gardening gloves while working in the yard.  General Instructions  Keep all follow-up visits as directed by your health care provider. This is important. This includes prenatal care and screening tests.  Manage any chronic health conditions. Work closely with your health care provider to keep conditions, such as diabetes, under control.  How do I know if my baby is developing well? At each prenatal visit, your health care provider will do several different tests to check on your health and keep track of your baby's development. These include:  Fundal height. ? Your health care provider will measure your growing belly from top to bottom using a tape measure. ? Your health care provider will also feel your belly  to determine your baby's position.  Heartbeat. ? An ultrasound in the first trimester can confirm pregnancy and show a heartbeat, depending on how far along you are. ? Your health care provider will check your baby's heart rate at every prenatal visit. ? As you get closer to your delivery date, you may have regular fetal heart rate monitoring to make sure that your baby is not in distress.  Second trimester ultrasound. ? This ultrasound checks your baby's development. It also indicates your baby's gender.  What should I do if I have concerns about my baby's development? Always talk with your health care provider about any concerns that you may have. This information is not intended to replace advice given to you by your health care provider. Make sure you discuss any questions you have with your health care provider. Document Released: 01/01/2008 Document Revised: 12/21/2015 Document Reviewed: 12/22/2013 Elsevier Interactive Patient Education  2018 [employer]. Morning  Sickness Morning sickness is when you feel sick to your stomach (nauseous) during pregnancy. You may feel sick to your stomach and throw up (vomit). You may feel sick in the morning, but you can feel this way any time of day. Some women feel very sick to their stomach and cannot stop throwing up (hyperemesis gravidarum). Follow these instructions at home:  Only take medicines as told by your doctor.  Take multivitamins as told by your doctor. Taking multivitamins before getting pregnant can stop or lessen the harshness of morning sickness.  Eat dry toast or unsalted crackers before getting out of bed.  Eat 5 to 6 small meals a day.  Eat dry and bland foods like rice and baked potatoes.  Do not drink liquids with meals. Drink between meals.  Do not eat greasy, fatty, or spicy foods.  Have someone cook for you if the smell of food causes you to feel sick or throw up.  If you feel sick to your stomach after taking prenatal vitamins, take them at night or with a snack.  Eat protein when you need a snack (nuts, yogurt, cheese).  Eat unsweetened gelatins for dessert.  Wear a bracelet used for sea sickness (acupressure wristband).  Go to a doctor that puts thin needles into certain body points (acupuncture) to improve how you feel.  Do not smoke.  Use a humidifier to keep the air in your house free of odors.  Get lots of fresh air. Contact a doctor if:  You need medicine to feel better.  You feel dizzy or lightheaded.  You are losing weight. Get help right away if:  You feel very sick to your stomach and cannot stop throwing up.  You pass out (faint). This information is not intended to replace advice given to you by your health care provider. Make sure you discuss any questions you have with your health care provider. Document Released: 08/22/2004 Document Revised: 12/21/2015 Document Reviewed: 12/30/2012 Elsevier Interactive Patient Education  2017 [place] of Pregnancy The first trimester of pregnancy is from week 1 until the end of week 13 (months 1 through 3). During this time, your baby will begin to develop inside you. At 6-8 weeks, the eyes and face are formed, and the heartbeat can be seen on ultrasound. At the end of 12 weeks, all the baby's organs are formed. Prenatal care is all the medical care you receive before the birth of your baby. Make sure you get good prenatal care and follow all of your doctor's instructions. Follow  these instructions at home: Medicines  Take over-the-counter and prescription medicines only as told by your doctor. Some medicines are safe and some medicines are not safe during pregnancy.  Take a prenatal vitamin that contains at least 600 micrograms (mcg) of folic acid.  If you have trouble pooping (constipation), take medicine that will make your stool soft (stool softener) if your doctor approves. Eating and drinking  Eat regular, healthy meals.  Your doctor will tell you the amount of weight gain that is right for you.  Avoid raw meat and uncooked cheese.  If you feel sick to your stomach (nauseous) or throw up (vomit): ? Eat 4 or 5 small meals a day instead of 3 large meals. ? Try eating a few soda crackers. ? Drink liquids between meals instead of during meals.  To prevent constipation: ? Eat foods that are high in fiber, like fresh fruits and vegetables, whole grains, and beans. ? Drink enough fluids to keep your pee (urine) clear or pale yellow. Activity  Exercise only as told by your doctor. Stop exercising if you have cramps or pain in your lower belly (abdomen) or low back.  Do not exercise if it is too hot, too humid, or if you are in a place of great height (high altitude).  Try to avoid standing for long periods of time. Move your legs often if you must stand in one place for a long time.  Avoid heavy lifting.  Wear low-heeled shoes. Sit and stand up straight.  You can have  sex unless your doctor tells you not to. Relieving pain and discomfort  Wear a good support bra if your breasts are sore.  Take warm water baths (sitz baths) to soothe pain or discomfort caused by hemorrhoids. Use hemorrhoid cream if your doctor says it is okay.  Rest with your legs raised if you have leg cramps or low back pain.  If you have puffy, bulging veins (varicose veins) in your legs: ? Wear support hose or compression stockings as told by your doctor. ? Raise (elevate) your feet for 15 minutes, 3-4 times a day. ? Limit salt in your food. Prenatal care  Schedule your prenatal visits by the twelfth week of pregnancy.  Write down your questions. Take them to your prenatal visits.  Keep all your prenatal visits as told by your doctor. This is important. Safety  Wear your seat belt at all times when driving.  Make a list of emergency phone numbers. The list should include numbers for family, friends, the hospital, and police and fire departments. General instructions  Ask your doctor for a referral to a local prenatal class. Begin classes no later than at the start of month 6 of your pregnancy.  Ask for help if you need counseling or if you need help with nutrition. Your doctor can give you advice or tell you where to go for help.  Do not use hot tubs, steam rooms, or saunas.  Do not douche or use tampons or scented sanitary pads.  Do not cross your legs for long periods of time.  Avoid all herbs and alcohol. Avoid drugs that are not approved by your doctor.  Do not use any tobacco products, including cigarettes, chewing tobacco, and electronic cigarettes. If you need help quitting, ask your doctor. You may get counseling or other support to help you quit.  Avoid cat litter boxes and soil used by cats. These carry germs that can cause birth defects in the baby and  can cause a loss of your baby (miscarriage) or stillbirth.  Visit your dentist. At home, brush your teeth  with a soft toothbrush. Be gentle when you floss. Contact a doctor if:  You are dizzy.  You have mild cramps or pressure in your lower belly.  You have a nagging pain in your belly area.  You continue to feel sick to your stomach, you throw up, or you have watery poop (diarrhea).  You have a bad smelling fluid coming from your vagina.  You have pain when you pee (urinate).  You have increased puffiness (swelling) in your face, hands, legs, or ankles. Get help right away if:  You have a fever.  You are leaking fluid from your vagina.  You have spotting or bleeding from your vagina.  You have very bad belly cramping or pain.  You gain or lose weight rapidly.  You throw up blood. It may look like coffee grounds.  You are around people who have [place] measles, fifth disease, or chickenpox.  You have a very bad headache.  You have shortness of breath.  You have any kind of trauma, such as from a fall or a car accident. Summary  The first trimester of pregnancy is from week 1 until the end of week 13 (months 1 through 3).  To take care of yourself and your unborn baby, you will need to eat healthy meals, take medicines only if your doctor tells you to do so, and do activities that are safe for you and your baby.  Keep all follow-up visits as told by your doctor. This is important as your doctor will have to ensure that your baby is healthy and growing well. This information is not intended to replace advice given to you by your health care provider. Make sure you discuss any questions you have with your health care provider. Document Released: 01/01/2008 Document Revised: 07/23/2016 Document Reviewed: 07/23/2016 Elsevier Interactive Patient Education  2017 [place]. Commonly Asked Questions During Pregnancy  Cats: A parasite can be excreted in cat feces.  To avoid exposure you need to have another person empty the little box.  If you must empty the litter box you will  need to wear gloves.  Wash your hands after handling your cat.  This parasite can also be found in raw or undercooked meat so this should also be avoided.  Colds, Sore Throats, Flu: Please check your medication sheet to see what you can take for symptoms.  If your symptoms are unrelieved by these medications please call the office.  Dental Work: Most any dental work Investment banker, corporate recommends is permitted.  X-rays should only be taken during the first trimester if absolutely necessary.  Your abdomen should be shielded with a lead apron during all x-rays.  Please notify your provider prior to receiving any x-rays.  Novocaine is fine; gas is not recommended.  If your dentist requires a note from [place] prior to dental work please call the office and we will provide one for you.  Exercise: Exercise is an important part of staying healthy during your pregnancy.  You may continue most exercises you were accustomed to prior to pregnancy.  Later in your pregnancy you will most likely notice you have difficulty with activities requiring balance like riding a bicycle.  It is important that you listen to your body and avoid activities that put you at a higher risk of falling.  Adequate rest and staying well hydrated are a must!  If  you have questions about the safety of specific activities ask your provider.    Exposure to Children with illness: Try to avoid obvious exposure; report any symptoms to [place] when noted,  If you have chicken pos, red measles or mumps, you should be immune to these diseases.   Please do not take any vaccines while pregnant unless you have checked with your OB provider.  Fetal Movement: After 28 weeks we recommend you do "kick counts" twice daily.  Lie or sit down in a calm quiet environment and count your baby movements "kicks".  You should feel your baby at least 10 times per hour.  If you have not felt 10 kicks within the first hour get up, walk around and have something sweet to eat or drink  then repeat for an additional hour.  If count remains less than 10 per hour notify your provider.  Fumigating: Follow your pest control agent's advice as to how long to stay out of your home.  Ventilate the area well before re-entering.  Hemorrhoids:   Most over-the-counter preparations can be used during pregnancy.  Check your medication to see what is safe to use.  It is important to use a stool softener or fiber in your diet and to drink lots of liquids.  If hemorrhoids seem to be getting worse please call the office.   Hot Tubs:  Hot tubs Jacuzzis and saunas are not recommended while pregnant.  These increase your internal body temperature and should be avoided.  Intercourse:  Sexual intercourse is safe during pregnancy as long as you are comfortable, unless otherwise advised by your provider.  Spotting may occur after intercourse; report any bright red bleeding that is heavier than spotting.  Labor:  If you know that you are in labor, please go to the hospital.  If you are unsure, please call the office and let us help you decide what to do.  Lifting, straining, etc:  If your job requires heavy lifting or straining please check with your provider for any limitations.  Generally, you should not lift items heavier than that you can lift simply with your hands and arms (no back muscles)  Painting:  Paint fumes do not harm your pregnancy, but may make you ill and should be avoided if possible.  Latex or water based paints have less odor than oils.  Use adequate ventilation while painting.  Permanents & Hair Color:  Chemicals in hair dyes are not recommended as they cause increase hair dryness which can increase hair loss during pregnancy.  " Highlighting" and permanents are allowed.  Dye may be absorbed differently and permanents may not hold as well during pregnancy.  Sunbathing:  Use a sunscreen, as skin burns easily during pregnancy.  Drink plenty of fluids; avoid over heating.  Tanning Beds:   Because their possible side effects are still unknown, tanning beds are not recommended.  Ultrasound Scans:  Routine ultrasounds are performed at approximately 20 weeks.  You will be able to see your baby's general anatomy an if you would like to know the gender this can usually be determined as well.  If it is questionable when you conceived you may also receive an ultrasound early in your pregnancy for dating purposes.  Otherwise ultrasound exams are not routinely performed unless there is a medical necessity.  Although you can request a scan we ask that you pay for it when conducted because insurance does not cover " patient request" scans.  Work: If your pregnancy  proceeds without complications you may work until your due date, unless your physician or employer advises otherwise.  Round Ligament Pain/Pelvic Discomfort:  Sharp, shooting pains not associated with bleeding are fairly common, usually occurring in the second trimester of pregnancy.  They tend to be worse when standing up or when you remain standing for long periods of time.  These are the result of pressure of certain pelvic ligaments called "round ligaments".  Rest, Tylenol and heat seem to be the most effective relief.  As the womb and fetus grow, they rise out of the pelvis and the discomfort improves.  Please notify the office if your pain seems different than that described.  It may represent a more serious condition.  Common Medications Safe in Pregnancy  Acne:      Constipation:  Benzoyl Peroxide     Colace  Clindamycin      Dulcolax Suppository  Topica Erythromycin     Fibercon  Salicylic Acid      Metamucil         Miralax AVOID:        Senakot   Accutane    Cough:  Retin-A       Cough Drops  Tetracycline      Phenergan w/ Codeine if Rx  Minocycline      Robitussin (Plain &  DM)  Antibiotics:     Crabs/Lice:  Ceclor       RID  Cephalosporins    AVOID:  E-Mycins      Kwell  Keflex  Macrobid/Macrodantin   Diarrhea:  Penicillin      Kao-Pectate  Zithromax      Imodium AD         PUSH FLUIDS AVOID:       Cipro     Fever:  Tetracycline      Tylenol (Regular or Extra  Minocycline       Strength)  Levaquin      Extra Strength-Do not          Exceed 8 tabs/24 hrs Caffeine:        <278m/day (equiv. To 1 cup of coffee or  approx. 3 12 oz sodas)         Gas: Cold/Hayfever:       Gas-X  Benadryl      Mylicon  Claritin       Phazyme  **Claritin-D        Chlor-Trimeton    Headaches:  Dimetapp      ASA-Free Excedrin  Drixoral-Non-Drowsy     Cold Compress  Mucinex (Guaifenasin)     Tylenol (Regular or Extra  Sudafed/Sudafed-12 Hour     Strength)  **Sudafed PE Pseudoephedrine   Tylenol Cold & Sinus     Vicks Vapor Rub  Zyrtec  **AVOID if Problems With Blood Pressure         Heartburn: Avoid lying down for at least 1 hour after meals  Aciphex      Maalox     Rash:  Milk of Magnesia     Benadryl    Mylanta       1% Hydrocortisone Cream  Pepcid  Pepcid Complete   Sleep Aids:  Prevacid      Ambien   Prilosec       Benadryl  Rolaids       Chamomile Tea  Tums (Limit 4/day)     Unisom  Zantac       Tylenol PM  Warm milk-add vanilla or  Hemorrhoids:       Sugar for taste  Anusol/Anusol H.C.  (RX: Analapram 2.5%)  Sugar Substitutes:  Hydrocortisone OTC     Ok in moderation  Preparation H      Tucks        Vaseline lotion applied to tissue with wiping    Herpes:     Throat:  Acyclovir      Oragel  Famvir  Valtrex     Vaccines:         Flu Shot Leg Cramps:       *Gardasil  Benadryl      Hepatitis A         Hepatitis B Nasal Spray:       Pneumovax  Saline Nasal Spray     Polio Booster         Tetanus Nausea:       Tuberculosis test or PPD  Vitamin B6 25 mg TID   AVOID:    Dramamine      *Gardasil  Emetrol       Live  Poliovirus  Ginger Root 250 mg QID    MMR (measles, mumps &  High Complex Carbs @ Bedtime    rebella)  Sea Bands-Accupressure    Varicella (Chickenpox)  Unisom 1/2 tab TID     *No known complications           If received before Pain:         Known pregnancy;   Darvocet       Resume series after  Lortab        Delivery  Percocet    Yeast:   Tramadol      Femstat  Tylenol 3      Gyne-lotrimin  Ultram       Monistat  Vicodin           MISC:         All Sunscreens           Hair Coloring/highlights          Insect Repellant's          (Including DEET)         Mystic Tans

## 2018-05-01 NOTE — Progress Notes (Signed)
      Felicia Scott presents for NOB nurse intake visit. Pregnancy confirmation done at [facility] , 03/29/18 G 5.  0040P.  LMP 03/13/18.  EDD 11/19/18.  Ga. Pregnancy education material explained and given.  0 cats in the home.  NOB labs ordered. BMI less than 30. TSH/HbgA1c not ordered. Sickle cell order due to race. HIV and drug screen explained and ordered. Genetic screening discussed. Genetic testing; Unsure. Pt to discuss genetic testing with provider. PNV encouraged. Pt to follow up with provider in 1.weeks for NOB physical.  [employer] form and [employer] form was reviewed and signed by pt. Pt stated that she understood.  BP 107/71   Pulse 96   Ht 5\' 3"  (1.6 m)   Wt 168 lb 4.8 oz (76.3 kg)   LMP 03/13/2018   BMI 29.81 kg/m

## 2018-05-02 LAB — CBC WITH DIFFERENTIAL/PLATELET
BASOS ABS: 0 10*3/uL (ref 0.0–0.2)
BASOS: 0 %
EOS (ABSOLUTE): 0 10*3/uL (ref 0.0–0.4)
Eos: 1 %
Hematocrit: 35.9 % (ref 34.0–46.6)
Hemoglobin: 12 g/dL (ref 11.1–15.9)
IMMATURE GRANULOCYTES: 0 %
Immature Grans (Abs): 0 10*3/uL (ref 0.0–0.1)
LYMPHS: 15 %
Lymphocytes Absolute: 1.2 10*3/uL (ref 0.7–3.1)
MCH: 29.2 pg (ref 26.6–33.0)
MCHC: 33.4 g/dL (ref 31.5–35.7)
MCV: 87 fL (ref 79–97)
Monocytes Absolute: 0.6 10*3/uL (ref 0.1–0.9)
Monocytes: 8 %
NEUTROS PCT: 76 %
Neutrophils Absolute: 6.2 10*3/uL (ref 1.4–7.0)
PLATELETS: 226 10*3/uL (ref 150–450)
RBC: 4.11 x10E6/uL (ref 3.77–5.28)
RDW: 13.6 % (ref 12.3–15.4)
WBC: 8.2 10*3/uL (ref 3.4–10.8)

## 2018-05-02 LAB — URINALYSIS, ROUTINE W REFLEX MICROSCOPIC
BILIRUBIN UA: NEGATIVE
Leukocytes, UA: NEGATIVE
Nitrite, UA: NEGATIVE
PH UA: 6 (ref 5.0–7.5)
Protein, UA: NEGATIVE
RBC UA: NEGATIVE
Specific Gravity, UA: >=1.03 — AB (ref 1.005–1.030)
Urobilinogen, Ur: 1 mg/dL (ref 0.2–1.0)

## 2018-05-02 LAB — RUBELLA SCREEN: Rubella Antibodies, IGG: 1.4 index (ref >0.99)

## 2018-05-02 LAB — ABO AND RH: RH TYPE: POSITIVE

## 2018-05-02 LAB — HEPATITIS B SURFACE ANTIGEN: Hepatitis B Surface Ag: NEGATIVE

## 2018-05-02 LAB — HGB SOLU + RFLX FRAC: SICKLE SOLUBILITY TEST - HGBRFX: NEGATIVE

## 2018-05-02 LAB — RPR: RPR Ser Ql: NONREACTIVE

## 2018-05-02 LAB — VARICELLA ZOSTER ANTIBODY, IGG: Varicella zoster IgG: 359 index (ref >165)

## 2018-05-02 LAB — HIV ANTIBODY (ROUTINE TESTING W REFLEX): HIV SCREEN 4TH GENERATION: NONREACTIVE

## 2018-05-03 LAB — URINE CULTURE, OB REFLEX

## 2018-05-03 LAB — CULTURE, OB URINE

## 2018-05-04 ENCOUNTER — Other Ambulatory Visit: Payer: Self-pay | Admitting: Certified Nurse Midwife

## 2018-05-04 LAB — GC/CHLAMYDIA PROBE AMP
Chlamydia trachomatis, NAA: NEGATIVE
Neisseria gonorrhoeae by PCR: NEGATIVE

## 2018-05-04 MED ORDER — NITROFURANTOIN MONOHYD MACRO 100 MG PO CAPS: 100.0000 mg | ORAL_CAPSULE | Freq: Two times a day (BID) | ORAL | 0 refills | Status: AC

## 2018-05-04 NOTE — Progress Notes (Signed)
Pt urine culture positive for UTI at nurse visit. Order placed for treatment.   Doreene Burke, CNM

## 2018-05-05 ENCOUNTER — Telehealth: Payer: Self-pay

## 2018-05-05 LAB — DRUG PROFILE, UR, 9 DRUGS (LABCORP)
Amphetamines, Urine: NEGATIVE ng/mL
BARBITURATE QUANT UR: NEGATIVE ng/mL
Benzodiazepine Quant, Ur: NEGATIVE ng/mL
COCAINE (METAB.): NEGATIVE ng/mL
Cannabinoid Quant, Ur: POSITIVE — AB
Methadone Screen, Urine: NEGATIVE ng/mL
Opiate Quant, Ur: NEGATIVE ng/mL
PCP Quant, Ur: NEGATIVE ng/mL
PROPOXYPHENE: NEGATIVE ng/mL

## 2018-05-05 LAB — NICOTINE SCREEN, URINE: COTININE UR QL SCN: NEGATIVE ng/mL

## 2018-05-05 NOTE — Telephone Encounter (Signed)
Pt informed of UTI - per AT- medication at pharmacy. Pt expressed understanding.

## 2018-05-06 ENCOUNTER — Encounter: Payer: Self-pay | Admitting: Certified Nurse Midwife

## 2018-05-06 ENCOUNTER — Ambulatory Visit (INDEPENDENT_AMBULATORY_CARE_PROVIDER_SITE_OTHER): Payer: Medicaid Other | Admitting: Certified Nurse Midwife

## 2018-05-06 VITALS — BP 108/62 | HR 91 | Wt 167.7 lb

## 2018-05-06 DIAGNOSIS — Z3491 Encounter for supervision of normal pregnancy, unspecified, first trimester: Secondary | ICD-10-CM

## 2018-05-06 LAB — POCT URINALYSIS DIPSTICK OB
BILIRUBIN UA: NEGATIVE
GLUCOSE, UA: NEGATIVE
Leukocytes, UA: NEGATIVE
Nitrite, UA: NEGATIVE
PH UA: 7.5 (ref 5.0–8.0)
SPEC GRAV UA: 1.01 (ref 1.010–1.025)
UROBILINOGEN UA: 0.2 U/dL

## 2018-05-06 NOTE — Progress Notes (Signed)
NEW OB HISTORY AND PHYSICAL  SUBJECTIVE:       Felicia Scott is a 22 y.o. G27P0040 female, Patient's last menstrual period was 03/13/2018., Estimated Date of Delivery: 11/19/18, [redacted]w[redacted]d, presents today for establishment of Prenatal Care. She has no unusual complaints.  Gynecologic History Patient's last menstrual period was 03/13/2018. Normal Contraception: none Last Pap: N/A.   Obstetric History OB History  Gravida Para Term Preterm AB Living  5       4    SAB TAB Ectopic Multiple Live Births  3 1          # Outcome Date GA Lbr Len/2nd Weight Sex Delivery Anes PTL Lv  5 Current           4 SAB           3 SAB           2 SAB           1 TAB             Past Medical History:  Diagnosis Date  . No pertinent past medical history     Past Surgical History:  Procedure Laterality Date  . APPENDECTOMY      Current Outpatient Medications on File Prior to Visit  Medication Sig Dispense Refill  . nitrofurantoin, macrocrystal-monohydrate, (MACROBID) 100 MG capsule Take 1 capsule (100 mg total) by mouth 2 (two) times daily for 7 days. 14 capsule 0  . Prenatal Vit-Fe Fumarate-FA (PRENATAL MULTIVITAMIN) TABS tablet Take 1 tablet by mouth daily at 12 noon.     No current facility-administered medications on file prior to visit.     No Known Allergies  Social History   Socioeconomic History  . Marital status: Single    Spouse name: Not on file  . Number of children: Not on file  . Years of education: Not on file  . Highest education level: Not on file  Occupational History  . Not on file  Social Needs  . Financial resource strain: Not on file  . Food insecurity:    Worry: Not on file    Inability: Not on file  . Transportation needs:    Medical: Not on file    Non-medical: Not on file  Tobacco Use  . Smoking status: Former Games developer  . Smokeless tobacco: Never Used  Substance and Sexual Activity  . Alcohol use: Not Currently  . Drug use: Yes    Types: Marijuana  .  Sexual activity: Yes    Birth control/protection: None  Lifestyle  . Physical activity:    Days per week: Not on file    Minutes per session: Not on file  . Stress: Not on file  Relationships  . Social connections:    Talks on phone: Not on file    Gets together: Not on file    Attends religious service: Not on file    Active member of club or organization: Not on file    Attends meetings of clubs or organizations: Not on file    Relationship status: Not on file  . Intimate partner violence:    Fear of current or ex partner: Not on file    Emotionally abused: Not on file    Physically abused: Not on file    Forced sexual activity: Not on file  Other Topics Concern  . Not on file  Social History Narrative  . Not on file    Family History  Problem Relation Age of Onset  .  Healthy Mother   . Healthy Father   . Hypertension Neg Hx   . Cancer Neg Hx   . Diabetes Neg Hx     The following portions of the patient's history were reviewed and updated as appropriate: allergies, current medications, past OB history, past medical history, past surgical history, past family history, past social history, and problem list.    OBJECTIVE: Initial Physical Exam (New OB)  GENERAL APPEARANCE: alert, well appearing, in no apparent distress, oriented to person, place and time HEAD: normocephalic, atraumatic MOUTH: mucous membranes moist, pharynx normal without lesions THYROID: no thyromegaly or masses present and neck hurts states she slept on it wrong last night BREASTS: no masses noted, no significant tenderness, no palpable axillary nodes, no skin changes LUNGS: clear to auscultation, no wheezes, rales or rhonchi, symmetric air entry HEART: regular rate and rhythm, no murmurs ABDOMEN: soft, nontender, nondistended, no abnormal masses, no epigastric pain. FHT-165 EXTREMITIES: no redness or tenderness in the calves or thighs SKIN: normal coloration and turgor, no rashes, bruise present  on right knee- pt states she feel. 2-3 busies on left leg in various stages of healing LYMPH NODES: no adenopathy palpable NEUROLOGIC: alert, oriented, normal speech, no focal findings or movement disorder noted  PELVIC EXAM EXTERNAL GENITALIA: normal appearing vulva with no masses, tenderness or lesions VAGINA: no abnormal discharge or lesions CERVIX: no lesions or cervical motion tenderness UTERUS: gravid ADNEXA: no masses palpable and nontender OB EXAM PELVIMETRY: appears adequate RECTUM: exam not indicated  ASSESSMENT: Normal pregnancy  PLAN: New OB counseling: The patient has been given an overview regarding routine prenatal care. Recommendations regarding diet, weight gain, and exercise in pregnancy were given. Discussed weight gain of 15-25 lbs. Encouraged healthy diet and exercise.  Prenatal testing, optional genetic testing, and ultrasound use in pregnancy were reviewed. Pt request cell free DNA testing. Panorama test order-phlebotismist to go the her to draw. Benefits of Breast Feeding were discussed. The patient is encouraged to consider nursing her baby post partum. The F.O.B. Was present and refused to leave for pelvic exam. Unable to assess for domestic Violence during this visit.   Doreene Burke, CNM

## 2018-05-06 NOTE — Patient Instructions (Addendum)
Eating Plan for Pregnant Women While you are pregnant, your body will require additional nutrition to help support your growing baby. It is recommended that you consume:  150 additional calories each day during your first trimester.  300 additional calories each day during your second trimester.  300 additional calories each day during your third trimester.  Eating a healthy, well-balanced diet is very important for your health and for your baby's health. You also have a higher need for some vitamins and minerals, such as folic acid, calcium, iron, and vitamin D. What do I need to know about eating during pregnancy?  Do not try to lose weight or go on a diet during pregnancy.  Choose healthy, nutritious foods. Choose  of a sandwich with a glass of milk instead of a candy bar or a high-calorie sugar-sweetened beverage.  Limit your overall intake of foods that have "empty calories." These are foods that have little nutritional value, such as sweets, desserts, candies, sugar-sweetened beverages, and fried foods.  Eat a variety of foods, especially fruits and vegetables.  Take a prenatal vitamin to help meet the additional needs during pregnancy, specifically for folic acid, iron, calcium, and vitamin D.  Remember to stay active. Ask your health care provider for exercise recommendations that are specific to you.  Practice good food safety and cleanliness, such as washing your hands before you eat and after you prepare raw meat. This helps to prevent foodborne illnesses, such as listeriosis, that can be very dangerous for your baby. Ask your health care provider for more information about listeriosis. What does 150 extra calories look like? Healthy options for an additional 150 calories each day could be any of the following:  Plain low-fat yogurt (6-8 oz) with  cup of berries.  1 apple with 2 teaspoons of peanut butter.  Cut-up vegetables with  cup of hummus.  Low-fat chocolate milk  (8 oz or 1 cup).  1 string cheese with 1 medium orange.   of a peanut butter and jelly sandwich on whole-wheat bread (1 tsp of peanut butter).  For 300 calories, you could eat two of those healthy options each day. What is a healthy amount of weight to gain? The recommended amount of weight for you to gain is based on your pre-pregnancy BMI. If your pre-pregnancy BMI was:  Less than 18 (underweight), you should gain 28-40 lb.  18-24.9 (normal), you should gain 25-35 lb.  25-29.9 (overweight), you should gain 15-25 lb.  Greater than 30 (obese), you should gain 11-20 lb.  What if I am having twins or multiples? Generally, pregnant women who will be having twins or multiples may need to increase their daily calories by 300-600 calories each day. The recommended range for total weight gain is 25-54 lb, depending on your pre-pregnancy BMI. Talk with your health care provider for specific guidance about additional nutritional needs, weight gain, and exercise during your pregnancy. What foods can I eat? Grains Any grains. Try to choose whole grains, such as whole-wheat bread, oatmeal, or brown rice. Vegetables Any vegetables. Try to eat a variety of colors and types of vegetables to get a full range of vitamins and minerals. Remember to wash your vegetables well before eating. Fruits Any fruits. Try to eat a variety of colors and types of fruit to get a full range of vitamins and minerals. Remember to wash your fruits well before eating. Meats and Other Protein Sources Lean meats, including chicken, [place], fish, and lean cuts of beef, veal,  or pork. Make sure that all meats are cooked to "well done." Tofu. Tempeh. Beans. Eggs. Peanut butter and other nut butters. Seafood, such as shrimp, crab, and lobster. If you choose fish, select types that are higher in omega-3 fatty acids, including salmon, herring, mussels, trout, sardines, and pollock. Make sure that all meats are cooked to food-safe  temperatures. Dairy Pasteurized milk and milk alternatives. Pasteurized yogurt and pasteurized cheese. Cottage cheese. Sour cream. Beverages Water. Juices that contain 100% fruit juice or vegetable juice. Caffeine-free teas and decaffeinated coffee. Drinks that contain caffeine are okay to drink, but it is better to avoid caffeine. Keep your total caffeine intake to less than 200 mg each day (12 oz of coffee, tea, or soda) or as directed by your health care provider. Condiments Any pasteurized condiments. Sweets and Desserts Any sweets and desserts. Fats and Oils Any fats and oils. The items listed above may not be a complete list of recommended foods or beverages. Contact your dietitian for more options. What foods are not recommended? Vegetables Unpasteurized (raw) vegetable juices. Fruits Unpasteurized (raw) fruit juices. Meats and Other Protein Sources Cured meats that have nitrates, such as bacon, salami, and hotdogs. Luncheon meats, bologna, or other deli meats (unless they are reheated until they are steaming hot). Refrigerated pate, meat spreads from a meat counter, smoked seafood that is found in the refrigerated section of a store. Raw fish, such as sushi or sashimi. High mercury content fish, such as tilefish, shark, swordfish, and king mackerel. Raw meats, such as tuna or beef tartare. Undercooked meats and poultry. Make sure that all meats are cooked to food-safe temperatures. Dairy Unpasteurized (raw) milk and any foods that have raw milk in them. Soft cheeses, such as feta, queso blanco, queso fresco, Brie, Camembert cheeses, blue-veined cheeses, and Panela cheese (unless it is made with pasteurized milk, which must be stated on the label). Beverages Alcohol. Sugar-sweetened beverages, such as sodas, teas, or energy drinks. Condiments Homemade fermented foods and drinks, such as pickles, sauerkraut, or kombucha drinks. (Store-bought pasteurized versions of these are  okay.) Other Salads that are made in the store, such as ham salad, chicken salad, egg salad, tuna salad, and seafood salad. The items listed above may not be a complete list of foods and beverages to avoid. Contact your dietitian for more information. This information is not intended to replace advice given to you by your health care provider. Make sure you discuss any questions you have with your health care provider. Document Released: 04/29/2014 Document Revised: 12/21/2015 Document Reviewed: 12/28/2013 Elsevier Interactive Patient Education  2018 Elsevier Inc. Prenatal Care WHAT IS PRENATAL CARE? Prenatal care is the process of caring for a pregnant woman before she gives birth. Prenatal care makes sure that she and her baby remain as healthy as possible throughout pregnancy. Prenatal care may be provided by a midwife, family practice health care provider, or a childbirth and pregnancy specialist (obstetrician). Prenatal care may include physical examinations, testing, treatments, and education on nutrition, lifestyle, and social support services. WHY IS PRENATAL CARE SO IMPORTANT? Early and consistent prenatal care increases the chance that you and your baby will remain healthy throughout your pregnancy. This type of care also decreases a baby's risk of being born too early (prematurely), or being born smaller than expected (small for gestational age). Any underlying medical conditions you may have that could pose a risk during your pregnancy are discussed during prenatal care visits. You will also be monitored regularly for any new   conditions that may arise during your pregnancy so they can be treated quickly and effectively. WHAT HAPPENS DURING PRENATAL CARE VISITS? Prenatal care visits may include the following: Discussion Tell your health care provider about any new signs or symptoms you have experienced since your last visit. These might include:  Nausea or vomiting.  Increased or  decreased level of energy.  Difficulty sleeping.  Back or leg pain.  Weight changes.  Frequent urination.  Shortness of breath with physical activity.  Changes in your skin, such as the development of a rash or itchiness.  Vaginal discharge or bleeding.  Feelings of excitement or nervousness.  Changes in your baby's movements.  You may want to write down any questions or topics you want to discuss with your health care provider and bring them with you to your appointment. Examination During your first prenatal care visit, you will likely have a complete physical exam. Your health care provider will often examine your vagina, cervix, and the position of your uterus, as well as check your heart, lungs, and other body systems. As your pregnancy progresses, your health care provider will measure the size of your uterus and your baby's position inside your uterus. He or she may also examine you for early signs of labor. Your prenatal visits may also include checking your blood pressure and, after about 10-12 weeks of pregnancy, listening to your baby's heartbeat. Testing Regular testing often includes:  Urinalysis. This checks your urine for glucose, protein, or signs of infection.  Blood count. This checks the levels of white and red blood cells in your body.  Tests for sexually transmitted infections (STIs). Testing for STIs at the beginning of pregnancy is routinely done and is required in many states.  Antibody testing. You will be checked to see if you are immune to certain illnesses, such as rubella, that can affect a developing fetus.  Glucose screen. Around 24-28 weeks of pregnancy, your blood glucose level will be checked for signs of gestational diabetes. Follow-up tests may be recommended.  Group B strep. This is a bacteria that is commonly found inside a woman's vagina. This test will inform your health care provider if you need an antibiotic to reduce the amount of this  bacteria in your body prior to labor and childbirth.  Ultrasound. Many pregnant women undergo an ultrasound screening around 18-20 weeks of pregnancy to evaluate the health of the fetus and check for any developmental abnormalities.  HIV (human immunodeficiency virus) testing. Early in your pregnancy, you will be screened for HIV. If you are at high risk for HIV, this test may be repeated during your third trimester of pregnancy.  You may be offered other testing based on your age, personal or family medical history, or other factors. HOW OFTEN SHOULD I PLAN TO SEE MY HEALTH CARE PROVIDER FOR PRENATAL CARE? Your prenatal care check-up schedule depends on any medical conditions you have before, or develop during, your pregnancy. If you do not have any underlying medical conditions, you will likely be seen for checkups:  Monthly, during the first 6 months of pregnancy.  Twice a month during months 7 and 8 of pregnancy.  Weekly starting in the 9th month of pregnancy and until delivery.  If you develop signs of early labor or other concerning signs or symptoms, you may need to see your health care provider more often. Ask your health care provider what prenatal care schedule is best for you. WHAT CAN I DO TO KEEP MYSELF AND   MY BABY AS HEALTHY AS POSSIBLE DURING MY PREGNANCY?  Take a prenatal vitamin containing 400 micrograms (0.4 mg) of folic acid every day. Your health care provider may also ask you to take additional vitamins such as iodine, vitamin D, iron, copper, and zinc.  Take 1500-2000 mg of calcium daily starting at your 20th week of pregnancy until you deliver your baby.  Make sure you are up to date on your vaccinations. Unless directed otherwise by your health care provider: ? You should receive a tetanus, diphtheria, and pertussis (Tdap) vaccination between the 27th and 36th week of your pregnancy, regardless of when your last Tdap immunization occurred. This helps protect your baby  from whooping cough (pertussis) after he or she is born. ? You should receive an annual inactivated influenza vaccine (IIV) to help protect you and your baby from influenza. This can be done at any point during your pregnancy.  Eat a well-rounded diet that includes: ? Fresh fruits and vegetables. ? Lean proteins. ? Calcium-rich foods such as milk, yogurt, hard cheeses, and dark, leafy greens. ? Whole grain breads.  Do noteat seafood high in mercury, including: ? Swordfish. ? Tilefish. ? Shark. ? King mackerel. ? More than 6 oz tuna per week.  Do not eat: ? Raw or undercooked meats or eggs. ? Unpasteurized foods, such as soft cheeses (brie, blue, or feta), juices, and milks. ? Lunch meats. ? Hot dogs that have not been heated until they are steaming.  Drink enough water to keep your urine clear or pale yellow. For many women, this may be 10 or more 8 oz glasses of water each day. Keeping yourself hydrated helps deliver nutrients to your baby and may prevent the start of pre-term uterine contractions.  Do not use any tobacco products including cigarettes, chewing tobacco, or electronic cigarettes. If you need help quitting, ask your health care provider.  Do not drink beverages containing alcohol. No safe level of alcohol consumption during pregnancy has been determined.  Do not use any illegal drugs. These can harm your developing baby or cause a miscarriage.  Ask your health care provider or pharmacist before taking any prescription or over-the-counter medicines, herbs, or supplements.  Limit your caffeine intake to no more than 200 mg per day.  Exercise. Unless told otherwise by your health care provider, try to get 30 minutes of moderate exercise most days of the week. Do not  do high-impact activities, contact sports, or activities with a high risk of falling, such as horseback riding or downhill skiing.  Get plenty of rest.  Avoid anything that raises your body temperature,  such as hot tubs and saunas.  If you own a cat, do not empty its litter box. Bacteria contained in cat feces can cause an infection called toxoplasmosis. This can result in serious harm to the fetus.  Stay away from chemicals such as insecticides, lead, mercury, and cleaning or paint products that contain solvents.  Do not have any X-rays taken unless medically necessary.  Take a childbirth and breastfeeding preparation class. Ask your health care provider if you need a referral or recommendation.  This information is not intended to replace advice given to you by your health care provider. Make sure you discuss any questions you have with your health care provider. Document Released: 07/18/2003 Document Revised: 12/18/2015 Document Reviewed: 09/29/2013 Elsevier Interactive Patient Education  2017 Elsevier Inc. Common Medications Safe in Pregnancy  Acne:      Constipation:  Benzoyl Peroxide       Colace  Clindamycin      Dulcolax Suppository  Topica Erythromycin     Fibercon  Salicylic Acid      Metamucil         Miralax AVOID:        Senakot   Accutane    Cough:  Retin-A       Cough Drops  Tetracycline      Phenergan w/ Codeine if Rx  Minocycline      Robitussin (Plain & DM)  Antibiotics:     Crabs/Lice:  Ceclor       RID  Cephalosporins    AVOID:  E-Mycins      Kwell  Keflex  Macrobid/Macrodantin   Diarrhea:  Penicillin      Kao-Pectate  Zithromax      Imodium AD         PUSH FLUIDS AVOID:       Cipro     Fever:  Tetracycline      Tylenol (Regular or Extra  Minocycline       Strength)  Levaquin      Extra Strength-Do not          Exceed 8 tabs/24 hrs Caffeine:        <251m/day (equiv. To 1 cup of coffee or  approx. 3 12 oz sodas)         Gas: Cold/Hayfever:       Gas-X  Benadryl      Mylicon  Claritin       Phazyme  **Claritin-D        Chlor-Trimeton    Headaches:  Dimetapp      ASA-Free Excedrin  Drixoral-Non-Drowsy     Cold Compress  Mucinex  (Guaifenasin)     Tylenol (Regular or Extra  Sudafed/Sudafed-12 Hour     Strength)  **Sudafed PE Pseudoephedrine   Tylenol Cold & Sinus     Vicks Vapor Rub  Zyrtec  **AVOID if Problems With Blood Pressure         Heartburn: Avoid lying down for at least 1 hour after meals  Aciphex      Maalox     Rash:  Milk of Magnesia     Benadryl    Mylanta       1% Hydrocortisone Cream  Pepcid  Pepcid Complete   Sleep Aids:  Prevacid      Ambien   Prilosec       Benadryl  Rolaids       Chamomile Tea  Tums (Limit 4/day)     Unisom  Zantac       Tylenol PM         Warm milk-add vanilla or  Hemorrhoids:       Sugar for taste  Anusol/Anusol H.C.  (RX: Analapram 2.5%)  Sugar Substitutes:  Hydrocortisone OTC     Ok in moderation  Preparation H      Tucks        Vaseline lotion applied to tissue with wiping    Herpes:     Throat:  Acyclovir      Oragel  Famvir  Valtrex     Vaccines:         Flu Shot Leg Cramps:       *Gardasil  Benadryl      Hepatitis A         Hepatitis B Nasal Spray:       Pneumovax  Saline Nasal Spray     Polio Booster  Tetanus Nausea:       Tuberculosis test or PPD  Vitamin B6 25 mg TID   AVOID:    Dramamine      *Gardasil  Emetrol       Live Poliovirus  Ginger Root 250 mg QID    MMR (measles, mumps &  High Complex Carbs @ Bedtime    rebella)  Sea Bands-Accupressure    Varicella (Chickenpox)  Unisom 1/2 tab TID     *No known complications           If received before Pain:         Known pregnancy;   Darvocet       Resume series after  Lortab        Delivery  Percocet    Yeast:   Tramadol      Femstat  Tylenol 3      Gyne-lotrimin  Ultram       Monistat  Vicodin           MISC:         All Sunscreens           Hair Coloring/highlights          Insect Repellant's          (Including DEET)         Mystic Tans

## 2018-05-11 ENCOUNTER — Encounter: Payer: Self-pay | Admitting: Certified Nurse Midwife

## 2018-05-18 ENCOUNTER — Telehealth: Payer: Self-pay

## 2018-05-18 NOTE — Telephone Encounter (Signed)
Informed pt of normal Panorama results per AT. Also informed gender info is in an envelope up front.

## 2018-06-03 ENCOUNTER — Ambulatory Visit (INDEPENDENT_AMBULATORY_CARE_PROVIDER_SITE_OTHER): Payer: Medicaid Other | Admitting: Obstetrics and Gynecology

## 2018-06-03 ENCOUNTER — Encounter: Payer: Self-pay | Admitting: Obstetrics and Gynecology

## 2018-06-03 VITALS — BP 98/62 | HR 104 | Wt 164.9 lb

## 2018-06-03 DIAGNOSIS — T7491XA Unspecified adult maltreatment, confirmed, initial encounter: Secondary | ICD-10-CM

## 2018-06-03 DIAGNOSIS — F322 Major depressive disorder, single episode, severe without psychotic features: Secondary | ICD-10-CM

## 2018-06-03 DIAGNOSIS — O99342 Other mental disorders complicating pregnancy, second trimester: Secondary | ICD-10-CM

## 2018-06-03 DIAGNOSIS — Z3492 Encounter for supervision of normal pregnancy, unspecified, second trimester: Secondary | ICD-10-CM

## 2018-06-03 LAB — POCT URINALYSIS DIPSTICK OB
BILIRUBIN UA: NEGATIVE
Blood, UA: NEGATIVE
GLUCOSE, UA: NEGATIVE
KETONES UA: 40
Leukocytes, UA: NEGATIVE
Nitrite, UA: NEGATIVE
POC,PROTEIN,UA: NEGATIVE
SPEC GRAV UA: 1.015 (ref 1.010–1.025)
Urobilinogen, UA: 0.2 E.U./dL
pH, UA: 6 (ref 5.0–8.0)

## 2018-06-03 NOTE — Patient Instructions (Signed)
Thank you for enrolling in MyChart. Please follow the instructions below to securely access your online medical record. MyChart allows you to send messages to your doctor, view your test results, renew your prescriptions, schedule appointments, and more.  How Do I Sign Up? 1. In your Internet browser, go to [email or web address] 2. Click on the New  User? link in the Sign In box.  3. Enter your MyChart Access Code exactly as it appears below. You will not need to use this code after you have completed the sign-up process. If you do not sign up before the expiration date, you must request a new code. MyChart Access Code: [email or web address] Expires: 07/18/2018  3:44 PM  4. Enter the last four digits of your Social Security Number (xxxx) and Date of Birth (mm/dd/yyyy) as indicated and click Next. You will be taken to the next sign-up page. 5. Create a MyChart ID. This will be your MyChart login ID and cannot be changed, so think of one that is secure and easy to remember. 6. Create a MyChart password. You can change your password at any time. 7. Enter your Password Reset Question and Answer and click Next. This can be used at a later time if you forget your password.  8. Select your communication preference, and if applicable enter your e-mail address. You will receive e-mail notification when new information is available in MyChart by choosing to receive e-mail notifications and filling in your e-mail. 9. Click Sign In. You can now view your medical record.   Additional Information If you have questions, you can email [email or web address] or call [phone] to talk to our MyChart staff. Remember, MyChart is NOT to be used for urgent needs. For medical emergencies, dial 911.
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
## 2018-06-22 ENCOUNTER — Telehealth: Payer: Self-pay | Admitting: Obstetrics and Gynecology

## 2018-06-22 ENCOUNTER — Telehealth: Payer: Self-pay | Admitting: Certified Nurse Midwife

## 2018-06-22 NOTE — Telephone Encounter (Signed)
The patient called and stated that she thinks she has strep throat and would like to know if she can make an appointment or what she need to do moving forward. Please advise.

## 2018-06-23 NOTE — Telephone Encounter (Signed)
Left pt vm, she has appointment 06/24/18

## 2018-06-24 ENCOUNTER — Ambulatory Visit (INDEPENDENT_AMBULATORY_CARE_PROVIDER_SITE_OTHER): Payer: Medicaid Other | Admitting: Certified Nurse Midwife

## 2018-06-24 ENCOUNTER — Encounter: Payer: Self-pay | Admitting: Certified Nurse Midwife

## 2018-06-24 VITALS — BP 90/50 | HR 84 | Wt 171.5 lb

## 2018-06-24 DIAGNOSIS — Z3492 Encounter for supervision of normal pregnancy, unspecified, second trimester: Secondary | ICD-10-CM | POA: Diagnosis not present

## 2018-06-24 LAB — POCT URINALYSIS DIPSTICK OB
BILIRUBIN UA: NEGATIVE
GLUCOSE, UA: NEGATIVE
Leukocytes, UA: NEGATIVE
NITRITE UA: NEGATIVE
POC,PROTEIN,UA: NEGATIVE
RBC UA: NEGATIVE
Urobilinogen, UA: 0.2 E.U./dL
pH, UA: 5 (ref 5.0–8.0)

## 2018-06-24 MED ORDER — VITAFOL GUMMIES 3.33-0.333-34.8 MG PO CHEW: 1.0000 | CHEWABLE_TABLET | Freq: Every day | ORAL | 4 refills | Status: DC

## 2018-06-24 NOTE — Progress Notes (Signed)
ROB doing well. States she is living with her grandparents and feels safe. She feels good movement. Discussed anatomy u/s in one week . She verbalixzes and agrees to plan . Follow up 1 wk.   Doreene BurkeAnnie Raymar Joiner, CNM

## 2018-06-24 NOTE — Patient Instructions (Signed)

## 2018-06-30 ENCOUNTER — Ambulatory Visit (INDEPENDENT_AMBULATORY_CARE_PROVIDER_SITE_OTHER): Payer: Medicaid Other | Admitting: Certified Nurse Midwife

## 2018-06-30 ENCOUNTER — Other Ambulatory Visit: Payer: Medicaid Other

## 2018-06-30 ENCOUNTER — Encounter: Payer: Self-pay | Admitting: Certified Nurse Midwife

## 2018-06-30 ENCOUNTER — Encounter: Payer: Medicaid Other | Admitting: Certified Nurse Midwife

## 2018-06-30 ENCOUNTER — Ambulatory Visit (INDEPENDENT_AMBULATORY_CARE_PROVIDER_SITE_OTHER): Payer: Medicaid Other

## 2018-06-30 ENCOUNTER — Other Ambulatory Visit: Payer: Self-pay

## 2018-06-30 VITALS — BP 75/50 | HR 91 | Wt 171.1 lb

## 2018-06-30 DIAGNOSIS — Z3492 Encounter for supervision of normal pregnancy, unspecified, second trimester: Secondary | ICD-10-CM | POA: Diagnosis not present

## 2018-06-30 LAB — POCT URINALYSIS DIPSTICK OB
Bilirubin, UA: NEGATIVE
Blood, UA: NEGATIVE
Glucose, UA: NEGATIVE
Ketones, UA: NEGATIVE
Leukocytes, UA: NEGATIVE
Nitrite, UA: NEGATIVE
POC,PROTEIN,UA: NEGATIVE
Spec Grav, UA: 1.025 (ref 1.010–1.025)
Urobilinogen, UA: 0.2 E.U./dL
pH, UA: 6 (ref 5.0–8.0)

## 2018-06-30 MED ORDER — VITAFOL GUMMIES 3.33-0.333-34.8 MG PO CHEW: 1.0000 | CHEWABLE_TABLET | Freq: Every day | ORAL | 4 refills | Status: DC

## 2018-06-30 NOTE — Patient Instructions (Signed)

## 2018-06-30 NOTE — Progress Notes (Signed)
ROB doing well. No complaints. Anatomy u/s today. Needs follow up u/s to complete all picture in 2 wks. See below. Follow up in 4 wks.    Patient Name: Lenore Cordiarma L Sawtelle DOB: 09/08/1995 MRN: [id number]  ULTRASOUND REPORT  Location: [facility] Date of Service: 06/30/2018   Indications:Anatomy Ultrasound Findings:  Mason JimSingleton intrauterine pregnancy is visualized with FHR at 140 BPM. Biometrics give an (U/S) Gestational age of [redacted]w[redacted]d and an (U/S) EDD of 11/16/18; this correlates with the clinically established Estimated Date of Delivery: 11/19/18  Fetal presentation is Cephalic.  EFW: wnl. Placenta: posterior. Grade: 0. Placental lake = 31x117mm AFI: subjectively normal.  Anatomic survey is incomplete for 4CH,LVOT,Nuchal fold d/t fetal position and normal; Gender - female.    Right Ovary is normal in appearance. Left Ovary is normal appearance. Survey of the adnexa demonstrates no adnexal masses. There is no free peritoneal fluid in the cul de sac.  Impression: 1. 3642w5d Viable Singleton Intrauterine pregnancy by U/S. 2. (U/S) EDD is consistent with Clinically established Estimated Date of Delivery: 11/19/18 . 3. Normal Anatomy Scan, incomplete for 4CH,LVOT,Nuchal fold d/t fetal position  4. Placental lake = 31x8217mm  Recommendations: 1.Clinical correlation with the patient's History and Physical Exam.   Abeer Alsammarraie ,RDMS  Doreene BurkeAnnie Ceasar Decandia, CNM

## 2018-07-01 ENCOUNTER — Encounter: Payer: Medicaid Other | Admitting: Obstetrics and Gynecology

## 2018-07-02 ENCOUNTER — Other Ambulatory Visit: Payer: Self-pay | Admitting: *Deleted

## 2018-07-02 ENCOUNTER — Telehealth: Payer: Self-pay | Admitting: Obstetrics and Gynecology

## 2018-07-02 MED ORDER — VITAFOL GUMMIES 3.33-0.333-34.8 MG PO CHEW: 1.0000 | CHEWABLE_TABLET | Freq: Two times a day (BID) | ORAL | 4 refills | Status: DC

## 2018-07-02 NOTE — Telephone Encounter (Signed)
Spoke with [facility]

## 2018-07-02 NOTE — Telephone Encounter (Signed)
Felicia Scott called from [facility] and stated that the Vitafusion has no iron and is the right gummy pricewise, however, the Rx was sent in for Vitafol which is the one with iron but is $100; she is asking for clarification.  She can be reached at [phone], please advise, thanks.

## 2018-07-14 ENCOUNTER — Ambulatory Visit (INDEPENDENT_AMBULATORY_CARE_PROVIDER_SITE_OTHER): Payer: Medicaid Other

## 2018-07-14 ENCOUNTER — Ambulatory Visit (INDEPENDENT_AMBULATORY_CARE_PROVIDER_SITE_OTHER): Payer: Medicaid Other | Admitting: Obstetrics and Gynecology

## 2018-07-14 VITALS — BP 92/55 | HR 79 | Wt 176.7 lb

## 2018-07-14 DIAGNOSIS — Z362 Encounter for other antenatal screening follow-up: Secondary | ICD-10-CM | POA: Diagnosis not present

## 2018-07-14 DIAGNOSIS — Z3492 Encounter for supervision of normal pregnancy, unspecified, second trimester: Secondary | ICD-10-CM

## 2018-07-14 LAB — POCT URINALYSIS DIPSTICK OB
Bilirubin, UA: NEGATIVE
Blood, UA: NEGATIVE
Glucose, UA: NEGATIVE
Ketones, UA: NEGATIVE
Leukocytes, UA: NEGATIVE
NITRITE UA: NEGATIVE
PROTEIN: NEGATIVE
Spec Grav, UA: 1.01 (ref 1.010–1.025)
Urobilinogen, UA: 0.2 E.U./dL
pH, UA: 7 (ref 5.0–8.0)

## 2018-07-14 NOTE — Progress Notes (Signed)
ROB and completing anatomy scan : reviewed scan findings below.  Indications: Anatomy follow up ultrasound Findings:  Singleton intrauterine pregnancy is visualized with FHR at 150 BPM.  Fetal presentation is Breech.  Placenta: posterior. Grade: 0. Placental lake = 9.3x27.458mm (was 31x7517mm) AFI: subjectively normal.  Anatomic survey is complete for 4CH,LVOT heart,Nichal fold  There is no free peritoneal fluid in the cul de sac.  Impression: 1. 7941w5d Viable Singleton Intrauterine pregnancy previously established criteria. 2. Normal Anatomy Scan is now complete for 4CH,LVOT heart,Nichal fold 3.  Placental lake = 9.3x27.598mm (was 31x6517mm)  Discussed pregnancy yoga and relaxion techniques

## 2018-07-14 NOTE — Progress Notes (Signed)
ROB- pt is doing well 

## 2018-07-28 NOTE — Telephone Encounter (Signed)
Error

## 2018-07-29 NOTE — L&D Delivery Note (Signed)
Delivery Note  0200 In room to see patient, reports increased pelvic pressure and intermittent back pain. SVE: 10/100/+2, vertex. Effective maternal pushing efforts noted in side lying positions.  0221 Spontaneous vaginal birth of liveborn female infant in right occiput anterior position. Infant immediately to maternal abdomen. Delayed cord clamping, skin to skin, three (3) vessel cord, and cord blood collected. APGARs: 8, 9. Weight pending. Receiving nurse present at bedside for birth.   0227 Spontaneous delivery of intact placenta. Right labial laceration repaired with 3-0 vicryl rapide under epidural anesthesia. Laceration hemostatic and well approximated. Uterus firm. Lochia small. QBL: 524 ml. Vault check completed. Counts correct x 2.   Initiate routine postpartum care and orders. Mom to postpartum.  Baby to Couplet care / Skin to Skin.  [name] present at bedside and overjoyed with the birth of "Gadiel".    Gunnar Bulla, CNM [facility] 11/16/2018, 2:52 AM

## 2018-08-07 ENCOUNTER — Other Ambulatory Visit: Payer: Self-pay | Admitting: *Deleted

## 2018-08-11 ENCOUNTER — Ambulatory Visit (INDEPENDENT_AMBULATORY_CARE_PROVIDER_SITE_OTHER): Payer: Medicaid Other | Admitting: Certified Nurse Midwife

## 2018-08-11 VITALS — BP 94/54 | HR 81 | Wt 179.6 lb

## 2018-08-11 DIAGNOSIS — O26892 Other specified pregnancy related conditions, second trimester: Secondary | ICD-10-CM

## 2018-08-11 DIAGNOSIS — Z3492 Encounter for supervision of normal pregnancy, unspecified, second trimester: Secondary | ICD-10-CM

## 2018-08-11 DIAGNOSIS — R102 Pelvic and perineal pain: Secondary | ICD-10-CM

## 2018-08-11 LAB — POCT URINALYSIS DIPSTICK OB
BILIRUBIN UA: NEGATIVE
Blood, UA: NEGATIVE
Glucose, UA: NEGATIVE
Ketones, UA: NEGATIVE
Leukocytes, UA: NEGATIVE
Nitrite, UA: NEGATIVE
POC,PROTEIN,UA: NEGATIVE
Spec Grav, UA: 1.025 (ref 1.010–1.025)
Urobilinogen, UA: 0.2 E.U./dL
pH, UA: 6 (ref 5.0–8.0)

## 2018-08-11 NOTE — Progress Notes (Signed)
ROB-Patient c/o intermittent pelvic and rib discomfort that started 1 month ago.

## 2018-08-11 NOTE — Patient Instructions (Addendum)
WE WOULD LOVE TO HEAR FROM YOU!!!!   Thank you Felicia Scott for visiting [facility].  Providing our patients with the best experience possible is really important to us, and we hope that you felt that on your recent visit. The most valuable feedback we get comes from YOU!!    If you receive a survey please take a couple of minutes to let us know how we did.Thank you for continuing to trust us with your care.   [facility]    Round Ligament Pain  The round ligament is a cord of muscle and tissue that helps support the uterus. It can become a source of pain during pregnancy if it becomes stretched or twisted as the baby grows. The pain usually begins in the second trimester (13-28 weeks) of pregnancy, and it can come and go until the baby is delivered. It is not a serious problem, and it does not cause harm to the baby. Round ligament pain is usually a short, sharp, and pinching pain, but it can also be a dull, lingering, and aching pain. The pain is felt in the lower side of the abdomen or in the groin. It usually starts deep in the groin and moves up to the outside of the hip area. The pain may occur when you:  Suddenly change position, such as quickly going from a sitting to standing position.  Roll over in bed.  Cough or sneeze.  Do physical activity. Follow these instructions at home:   Watch your condition for any changes.  When the pain starts, relax. Then try any of these methods to help with the pain: ? Sitting down. ? Flexing your knees up to your abdomen. ? Lying on your side with one pillow under your abdomen and another pillow between your legs. ? Sitting in a warm bath for 15-20 minutes or until the pain goes away.  Take over-the-counter and prescription medicines only as told by your health care provider.  Move slowly when you sit down or stand up.  Avoid long walks if they cause pain.  Stop or reduce your physical activities if they  cause pain.  Keep all follow-up visits as told by your health care provider. This is important. Contact a health care provider if:  Your pain does not go away with treatment.  You feel pain in your back that you did not have before.  Your medicine is not helping. Get help right away if:  You have a fever or chills.  You develop uterine contractions.  You have vaginal bleeding.  You have nausea or vomiting.  You have diarrhea.  You have pain when you urinate. Summary  Round ligament pain is felt in the lower abdomen or groin. It is usually a short, sharp, and pinching pain. It can also be a dull, lingering, and aching pain.  This pain usually begins in the second trimester (13-28 weeks). It occurs because the uterus is stretching with the growing baby, and it is not harmful to the baby.  You may notice the pain when you suddenly change position, when you cough or sneeze, or during physical activity.  Relaxing, flexing your knees to your abdomen, lying on one side, or taking a warm bath may help to get rid of the pain.  Get help from your health care provider if the pain does not go away or if you have vaginal bleeding, nausea, vomiting, diarrhea, or painful urination. This information is not intended to replace  advice given to you by your health care provider. Make sure you discuss any questions you have with your health care provider. Document Released: 04/23/2008 Document Revised: 12/31/2017 Document Reviewed: 12/31/2017 Elsevier Interactive Patient Education  2019 Elsevier Inc. Back Pain in Pregnancy Back pain during pregnancy is common. Back pain may be caused by several factors that are related to changes during your pregnancy. Follow these instructions at home: Managing pain, stiffness, and swelling      If directed, for sudden (acute) back pain, put ice on the painful area. ? Put ice in a plastic bag. ? Place a towel between your skin and the bag. ? Leave the  ice on for 20 minutes, 2-3 times per day.  If directed, apply heat to the affected area before you exercise. Use the heat source that your health care provider recommends, such as a moist heat pack or a heating pad. ? Place a towel between your skin and the heat source. ? Leave the heat on for 20-30 minutes. ? Remove the heat if your skin turns bright red. This is especially important if you are unable to feel pain, heat, or cold. You may have a greater risk of getting burned.  If directed, massage the affected area. Activity  Exercise as told by your health care provider. Gentle exercise is the best way to prevent or manage back pain.  Listen to your body when lifting. If lifting hurts, ask for help or bend your knees. This uses your leg muscles instead of your back muscles.  Squat down when picking up something from the floor. Do not bend over.  Only use bed rest for short periods as told by your health care provider. Bed rest should only be used for the most severe episodes of back pain. Standing, sitting, and lying down  Do not stand in one place for long periods of time.  Use good posture when sitting. Make sure your head rests over your shoulders and is not hanging forward. Use a pillow on your lower back if necessary.  Try sleeping on your side, preferably the left side, with a pregnancy support pillow or 1-2 regular pillows between your legs. ? If you have back pain after a night's rest, your bed may be too soft. ? A firm mattress may provide more support for your back during pregnancy. General instructions  Do not wear high heels.  Eat a healthy diet. Try to gain weight within your health care provider's recommendations.  Use a maternity girdle, elastic sling, or back brace as told by your health care provider.  Take over-the-counter and prescription medicines only as told by your health care provider.  Work with a physical therapist or massage therapist to find ways to  manage back pain. Acupuncture or massage therapy may be helpful.  Keep all follow-up visits as told by your health care provider. This is important. Contact a health care provider if:  Your back pain interferes with your daily activities.  You have increasing pain in other parts of your body. Get help right away if:  You develop numbness, tingling, weakness, or problems with the use of your arms or legs.  You develop severe back pain that is not controlled with medicine.  You have a change in bowel or bladder control.  You develop shortness of breath, dizziness, or you faint.  You develop nausea, vomiting, or sweating.  You have back pain that is a rhythmic, cramping pain similar to labor pains. Labor pain is usually  1-2 minutes apart, lasts for about 1 minute, and involves a bearing down feeling or pressure in your pelvis.  You have back pain and your water breaks or you have vaginal bleeding.  You have back pain or numbness that travels down your leg.  Your back pain developed after you fell.  You develop pain on one side of your back.  You see blood in your urine.  You develop skin blisters in the area of your back pain. Summary  Back pain may be caused by several factors that are related to changes during your pregnancy.  Follow instructions as told by your health care provider for managing pain, stiffness, and swelling.  Exercise as told by your health care provider. Gentle exercise is the best way to prevent or manage back pain.  Take over-the-counter and prescription medicines only as told by your health care provider.  Keep all follow-up visits as told by your health care provider. This is important. This information is not intended to replace advice given to you by your health care provider. Make sure you discuss any questions you have with your health care provider. Document Released: 10/23/2005 Document Revised: 12/31/2017 Document Reviewed:  12/31/2017 Elsevier Interactive Patient Education  2019 Elsevier Inc. WHAT OB PATIENTS CAN EXPECT   Confirmation of pregnancy and ultrasound ordered if medically indicated-[redacted] weeks gestation  New OB (NOB) intake with nurse and New OB (NOB) labs- [redacted] weeks gestation  New OB (NOB) physical examination with provider- 11/[redacted] weeks gestation  Flu vaccine-[redacted] weeks gestation  Anatomy scan-[redacted] weeks gestation  Glucose tolerance test, blood work to test for anemia, T-dap vaccine-[redacted] weeks gestation  Vaginal swabs/cultures-STD/Group B strep-[redacted] weeks gestation  Appointments every 4 weeks until 28 weeks  Every 2 weeks from 28 weeks until 36 weeks  Weekly visits from 36 weeks until delivery  Third Trimester of Pregnancy  The third trimester is from week 28 through week 40 (months 7 through 9). This trimester is when your unborn baby (fetus) is growing very fast. At the end of the ninth month, the unborn baby is about 20 inches in length. It weighs about 6-10 pounds. Follow these instructions at home: Medicines  Take over-the-counter and prescription medicines only as told by your doctor. Some medicines are safe and some medicines are not safe during pregnancy.  Take a prenatal vitamin that contains at least 600 micrograms (mcg) of folic acid.  If you have trouble pooping (constipation), take medicine that will make your stool soft (stool softener) if your doctor approves. Eating and drinking   Eat regular, healthy meals.  Avoid raw meat and uncooked cheese.  If you get low calcium from the food you eat, talk to your doctor about taking a daily calcium supplement.  Eat four or five small meals rather than three large meals a day.  Avoid foods that are high in fat and sugars, such as fried and sweet foods.  To prevent constipation: ? Eat foods that are high in fiber, like fresh fruits and vegetables, whole grains, and beans. ? Drink enough fluids to keep your pee (urine) clear or pale  yellow. Activity  Exercise only as told by your doctor. Stop exercising if you start to have cramps.  Avoid heavy lifting, wear low heels, and sit up straight.  Do not exercise if it is too hot, too humid, or if you are in a place of great height (high altitude).  You may continue to have sex unless your doctor tells you not to.  Relieving pain and discomfort  Wear a good support bra if your breasts are tender.  Take frequent breaks and rest with your legs raised if you have leg cramps or low back pain.  Take warm water baths (sitz baths) to soothe pain or discomfort caused by hemorrhoids. Use hemorrhoid cream if your doctor approves.  If you develop puffy, bulging veins (varicose veins) in your legs: ? Wear support hose or compression stockings as told by your doctor. ? Raise (elevate) your feet for 15 minutes, 3-4 times a day. ? Limit salt in your food. Safety  Wear your seat belt when driving.  Make a list of emergency phone numbers, including numbers for family, friends, the hospital, and police and fire departments. Preparing for your baby's arrival To prepare for the arrival of your baby:  Take prenatal classes.  Practice driving to the hospital.  Visit the hospital and tour the maternity area.  Talk to your work about taking leave once the baby comes.  Pack your hospital bag.  Prepare the baby's room.  Go to your doctor visits.  Buy a rear-facing car seat. Learn how to install it in your car. General instructions  Do not use hot tubs, steam rooms, or saunas.  Do not use any products that contain nicotine or tobacco, such as cigarettes and e-cigarettes. If you need help quitting, ask your doctor.  Do not drink alcohol.  Do not douche or use tampons or scented sanitary pads.  Do not cross your legs for long periods of time.  Do not travel for long distances unless you must. Only do so if your doctor says it is okay.  Visit your dentist if you have not  gone during your pregnancy. Use a soft toothbrush to brush your teeth. Be gentle when you floss.  Avoid cat litter boxes and soil used by cats. These carry germs that can cause birth defects in the baby and can cause a loss of your baby (miscarriage) or stillbirth.  Keep all your prenatal visits as told by your doctor. This is important. Contact a doctor if:  You are not sure if you are in labor or if your water has broken.  You are dizzy.  You have mild cramps or pressure in your lower belly.  You have a nagging pain in your belly area.  You continue to feel sick to your stomach, you throw up, or you have watery poop.  You have bad smelling fluid coming from your vagina.  You have pain when you pee. Get help right away if:  You have a fever.  You are leaking fluid from your vagina.  You are spotting or bleeding from your vagina.  You have severe belly cramps or pain.  You lose or gain weight quickly.  You have trouble catching your breath and have chest pain.  You notice sudden or extreme puffiness (swelling) of your face, hands, ankles, feet, or legs.  You have not felt the baby move in over an hour.  You have severe headaches that do not go away with medicine.  You have trouble seeing.  You are leaking, or you are having a gush of fluid, from your vagina before you are 37 weeks.  You have regular belly spasms (contractions) before you are 37 weeks. Summary  The third trimester is from week 28 through week 40 (months 7 through 9). This time is when your unborn baby is growing very fast.  Follow your doctor's advice about medicine, food, and  activity.  Get ready for the arrival of your baby by taking prenatal classes, getting all the baby items ready, preparing the baby's room, and visiting your doctor to be checked.  Get help right away if you are bleeding from your vagina, or you have chest pain and trouble catching your breath, or if you have not felt your  baby move in over an hour. This information is not intended to replace advice given to you by your health care provider. Make sure you discuss any questions you have with your health care provider. Document Released: 10/09/2009 Document Revised: 08/20/2016 Document Reviewed: 08/20/2016 Elsevier Interactive Patient Education  2019 [employer].

## 2018-08-13 ENCOUNTER — Encounter: Payer: Self-pay | Admitting: Certified Nurse Midwife

## 2018-08-13 DIAGNOSIS — R102 Pelvic and perineal pain: Secondary | ICD-10-CM | POA: Insufficient documentation

## 2018-08-13 DIAGNOSIS — O26892 Other specified pregnancy related conditions, second trimester: Secondary | ICD-10-CM | POA: Insufficient documentation

## 2018-08-13 NOTE — Progress Notes (Signed)
ROB-Reports intermittent pelvic pain and rib discomfort x 1 month. Discussed home treatment measures including use of abdominal support. Living with grandparents, feels safe. Naming son Kaleen Mask. Desires natural childbirth. Plans breastfeeding. Anticipatory guidance regarding course of prenatal care. Reviewed red flag symptoms and when to call. RTC x 3 weeks for 28 week labs and ROB or sooner if needed.

## 2018-08-17 ENCOUNTER — Encounter: Payer: Self-pay | Admitting: Certified Nurse Midwife

## 2018-08-31 ENCOUNTER — Ambulatory Visit (INDEPENDENT_AMBULATORY_CARE_PROVIDER_SITE_OTHER): Payer: Medicaid Other | Admitting: Certified Nurse Midwife

## 2018-08-31 ENCOUNTER — Encounter: Payer: Self-pay | Admitting: Certified Nurse Midwife

## 2018-08-31 ENCOUNTER — Other Ambulatory Visit: Payer: Medicaid Other

## 2018-08-31 VITALS — BP 95/57 | HR 92 | Wt 188.1 lb

## 2018-08-31 DIAGNOSIS — Z3492 Encounter for supervision of normal pregnancy, unspecified, second trimester: Secondary | ICD-10-CM

## 2018-08-31 LAB — POCT URINALYSIS DIPSTICK OB
Bilirubin, UA: NEGATIVE
Blood, UA: NEGATIVE
Glucose, UA: NEGATIVE
KETONES UA: NEGATIVE
Leukocytes, UA: NEGATIVE
Nitrite, UA: NEGATIVE
POC,PROTEIN,UA: NEGATIVE
Spec Grav, UA: 1.025 (ref 1.010–1.025)
Urobilinogen, UA: 0.2 E.U./dL
pH, UA: 6 (ref 5.0–8.0)

## 2018-08-31 MED ORDER — TETANUS-DIPHTH-ACELL PERTUSSIS 5-2.5-18.5 LF-MCG/0.5 IM SUSP
0.5000 mL | Freq: Once | INTRAMUSCULAR | Status: AC
  Administered 2018-08-31: 0.5 mL via INTRAMUSCULAR

## 2018-08-31 NOTE — Patient Instructions (Signed)
Glucose Tolerance Test During Pregnancy Why am I having this test? The glucose tolerance test (GTT) is done to check how your body processes sugar (glucose). This is one of several tests used to diagnose diabetes that develops during pregnancy (gestational diabetes mellitus). Gestational diabetes is a temporary form of diabetes that some women develop during pregnancy. It usually occurs during the second trimester of pregnancy and goes away after delivery. Testing (screening) for gestational diabetes usually occurs between 24 and 28 weeks of pregnancy. You may have the GTT test after having a 1-hour glucose screening test if the results from that test indicate that you may have gestational diabetes. You may also have this test if:  You have a history of gestational diabetes.  You have a history of giving birth to very large babies or have experienced repeated fetal loss (stillbirth).  You have signs and symptoms of diabetes, such as: ? Changes in your vision. ? Tingling or numbness in your hands or feet. ? Changes in hunger, thirst, and urination that are not otherwise explained by your pregnancy. What is being tested? This test measures the amount of glucose in your blood at different times during a period of 3 hours. This indicates how well your body is able to process glucose. What kind of sample is taken?  Blood samples are required for this test. They are usually collected by inserting a needle into a blood vessel. How do I prepare for this test?  For 3 days before your test, eat normally. Have plenty of carbohydrate-rich foods.  Follow instructions from your health care provider about: ? Eating or drinking restrictions on the day of the test. You may be asked to not eat or drink anything other than water (fast) starting 8-10 hours before the test. ? Changing or stopping your regular medicines. Some medicines may interfere with this test. Tell a health care provider about:  All  medicines you are taking, including vitamins, herbs, eye drops, creams, and over-the-counter medicines.  Any blood disorders you have.  Any surgeries you have had.  Any medical conditions you have. What happens during the test? First, your blood glucose will be measured. This is referred to as your fasting blood glucose, since you fasted before the test. Then, you will drink a glucose solution that contains a certain amount of glucose. Your blood glucose will be measured again 1, 2, and 3 hours after drinking the solution. This test takes about 3 hours to complete. You will need to stay at the testing location during this time. During the testing period:  Do not eat or drink anything other than the glucose solution.  Do not exercise.  Do not use any products that contain nicotine or tobacco, such as cigarettes and e-cigarettes. If you need help stopping, ask your health care provider. The testing procedure may vary among health care providers and hospitals. How are the results reported? Your results will be reported as milligrams of glucose per deciliter of blood (mg/dL) or millimoles per liter (mmol/L). Your health care provider will compare your results to normal ranges that were established after testing a large group of people (reference ranges). Reference ranges may vary among labs and hospitals. For this test, common reference ranges are:  Fasting: less than 95-105 mg/dL (5.3-5.8 mmol/L).  1 hour after drinking glucose: less than 180-190 mg/dL (10.0-10.5 mmol/L).  2 hours after drinking glucose: less than 155-165 mg/dL (8.6-9.2 mmol/L).  3 hours after drinking glucose: 140-145 mg/dL (7.8-8.1 mmol/L). What do the   results mean? Results within reference ranges are considered normal, meaning that your glucose levels are well-controlled. If two or more of your blood glucose levels are high, you may be diagnosed with gestational diabetes. If only one level is high, your health care  provider may suggest repeat testing or other tests to confirm a diagnosis. Talk with your health care provider about what your results mean. Questions to ask your health care provider Ask your health care provider, or the department that is doing the test:  When will my results be ready?  How will I get my results?  What are my treatment options?  What other tests do I need?  What are my next steps? Summary  The glucose tolerance test (GTT) is one of several tests used to diagnose diabetes that develops during pregnancy (gestational diabetes mellitus). Gestational diabetes is a temporary form of diabetes that some women develop during pregnancy.  You may have the GTT test after having a 1-hour glucose screening test if the results from that test indicate that you may have gestational diabetes. You may also have this test if you have any symptoms or risk factors for gestational diabetes.  Talk with your health care provider about what your results mean. This information is not intended to replace advice given to you by your health care provider. Make sure you discuss any questions you have with your health care provider. Document Released: 01/14/2012 Document Revised: 02/24/2017 Document Reviewed: 02/24/2017 Elsevier Interactive Patient Education  2019 [employer]  

## 2018-08-31 NOTE — Progress Notes (Signed)
ROB doing well. Glucose/Tdap/BTC/RPR/CBC today. Discussed birth plan. Sample birth plan given. Pt states she has pamphlet for doula program already. Reviewed Birth control after baby, pamphlet given. She is interested in nuva ring. Encourage POP until breastfeeding well established. She verbalizes and agrees to plan. Follow up 2 wk  Doreene Burke, CNM

## 2018-09-01 LAB — CBC
HEMATOCRIT: 34.8 % (ref 34.0–46.6)
HEMOGLOBIN: 11.2 g/dL (ref 11.1–15.9)
MCH: 28.9 pg (ref 26.6–33.0)
MCHC: 32.2 g/dL (ref 31.5–35.7)
MCV: 90 fL (ref 79–97)
Platelets: 229 10*3/uL (ref 150–450)
RBC: 3.87 x10E6/uL (ref 3.77–5.28)
RDW: 13 % (ref 11.7–15.4)
WBC: 6.7 10*3/uL (ref 3.4–10.8)

## 2018-09-01 LAB — RPR: RPR Ser Ql: NONREACTIVE

## 2018-09-01 LAB — GLUCOSE, 1 HOUR GESTATIONAL: Gestational Diabetes Screen: 69 mg/dL (ref 65–139)

## 2018-09-15 ENCOUNTER — Ambulatory Visit (INDEPENDENT_AMBULATORY_CARE_PROVIDER_SITE_OTHER): Payer: Medicaid Other | Admitting: Obstetrics and Gynecology

## 2018-09-15 VITALS — BP 87/53 | HR 85 | Wt 187.0 lb

## 2018-09-15 DIAGNOSIS — Z3492 Encounter for supervision of normal pregnancy, unspecified, second trimester: Secondary | ICD-10-CM

## 2018-09-15 LAB — POCT URINALYSIS DIPSTICK OB
Blood, UA: NEGATIVE
GLUCOSE, UA: NEGATIVE
Ketones, UA: NEGATIVE
Leukocytes, UA: NEGATIVE
Nitrite, UA: NEGATIVE
Spec Grav, UA: >=1.03 — AB (ref 1.010–1.025)
Urobilinogen, UA: NEGATIVE E.U./dL — AB
pH, UA: 6 (ref 5.0–8.0)

## 2018-09-15 NOTE — Progress Notes (Signed)
ROB. Pt states that she is doing well. The patient states that she feels like she has had a few yeast infections and that she possibly has one now.

## 2018-09-15 NOTE — Progress Notes (Signed)
ROB- doing well, discussed yeast infections in pregnancy, Microscopic wet-mount exam shows negative for pathogens, normal epithelial cells.doesn't plan on going to classes as she doesn't want to go alone. PLANS MOTHER AS MAIN SUPPORT PERSON IN LABOR.

## 2018-09-15 NOTE — Patient Instructions (Signed)
Preterm Labor and Birth Information °Pregnancy normally lasts 39-41 weeks. Preterm labor is when labor starts early. It starts before you have been pregnant for 37 whole weeks. °What are the risk factors for preterm labor? °Preterm labor is more likely to occur in women who: °· Have an infection while pregnant. °· Have a cervix that is short. °· Have gone into preterm labor before. °· Have had surgery on their cervix. °· Are younger than age 21. °· Are older than age 35. °· Are African American. °· Are pregnant with two or more babies. °· Take street drugs while pregnant. °· Smoke while pregnant. °· Do not gain enough weight while pregnant. °· Got pregnant right after another pregnancy. °What are the symptoms of preterm labor? °Symptoms of preterm labor include: °· Cramps. The cramps may feel like the cramps some women get during their period. The cramps may happen with watery poop (diarrhea). °· Pain in the belly (abdomen). °· Pain in the lower back. °· Regular contractions or tightening. It may feel like your belly is getting tighter. °· Pressure in the lower belly that seems to get stronger. °· More fluid (discharge) leaking from the vagina. The fluid may be watery or bloody. °· Water breaking. °Why is it important to notice signs of preterm labor? °Babies who are born early may not be fully developed. They have a higher chance for: °· Long-term heart problems. °· Long-term lung problems. °· Trouble controlling body systems, like breathing. °· Bleeding in the brain. °· A condition called cerebral palsy. °· Learning difficulties. °· Death. °These risks are highest for babies who are born before 34 weeks of pregnancy. °How is preterm labor treated? °Treatment depends on: °· How long you were pregnant. °· Your condition. °· The health of your baby. °Treatment may involve: °· Having a stitch (suture) placed in your cervix. When you give birth, your cervix opens so the baby can come out. The stitch keeps the cervix  from opening too soon. °· Staying at the hospital. °· Taking or getting medicines, such as: °? Hormone medicines. °? Medicines to stop contractions. °? Medicines to help the baby’s lungs develop. °? Medicines to prevent your baby from having cerebral palsy. °What should I do if I am in preterm labor? °If you think you are going into labor too soon, call your doctor right away. °How can I prevent preterm labor? °· Do not use any tobacco products. °? Examples of these are cigarettes, chewing tobacco, and e-cigarettes. °? If you need help quitting, ask your doctor. °· Do not use street drugs. °· Do not use any medicines unless you ask your doctor if they are safe for you. °· Talk with your doctor before taking any herbal supplements. °· Make sure you gain enough weight. °· Watch for infection. If you think you might have an infection, get it checked right away. °· If you have gone into preterm labor before, tell your doctor. °This information is not intended to replace advice given to you by your health care provider. Make sure you discuss any questions you have with your health care provider. °Document Released: 10/11/2008 Document Revised: 12/26/2015 Document Reviewed: 12/06/2015 °Elsevier Interactive Patient Education © 2019 [employer] ° °

## 2018-09-17 LAB — GC/CHLAMYDIA PROBE AMP
CHLAMYDIA, DNA PROBE: NEGATIVE
Neisseria gonorrhoeae by PCR: NEGATIVE

## 2018-09-29 ENCOUNTER — Encounter: Payer: Medicaid Other | Admitting: Certified Nurse Midwife

## 2018-10-05 ENCOUNTER — Encounter: Payer: Self-pay | Admitting: Certified Nurse Midwife

## 2018-10-09 ENCOUNTER — Ambulatory Visit (INDEPENDENT_AMBULATORY_CARE_PROVIDER_SITE_OTHER): Payer: Medicaid Other | Admitting: Certified Nurse Midwife

## 2018-10-09 ENCOUNTER — Other Ambulatory Visit: Payer: Self-pay

## 2018-10-09 VITALS — BP 116/76 | HR 84 | Wt 197.8 lb

## 2018-10-09 DIAGNOSIS — Z3493 Encounter for supervision of normal pregnancy, unspecified, third trimester: Secondary | ICD-10-CM

## 2018-10-09 NOTE — Progress Notes (Signed)
ROB- pt "was crying at appointment today, states she was upset that [name] was gonna make her late to appt", reassured pt JML would see pt

## 2018-10-09 NOTE — Patient Instructions (Addendum)
WE WOULD LOVE TO HEAR FROM YOU!!!!   Thank you Felicia Scott for visiting [facility].  Providing our patients with the best experience possible is really important to [place], and we hope that you felt that on your recent visit. The most valuable feedback we get comes from YOU!!    If you receive a survey please take a couple of minutes to let us know how we did.Thank you for continuing to trust [place] with your care.   [facility]   Back Pain in Pregnancy Back pain during pregnancy is common. Back pain may be caused by several factors that are related to changes during your pregnancy. Follow these instructions at home: Managing pain, stiffness, and swelling      If directed, for sudden (acute) back pain, put ice on the painful area. ? Put ice in a plastic bag. ? Place a towel between your skin and the bag. ? Leave the ice on for 20 minutes, 2-3 times per day.  If directed, apply heat to the affected area before you exercise. Use the heat source that your health care provider recommends, such as a moist heat pack or a heating pad. ? Place a towel between your skin and the heat source. ? Leave the heat on for 20-30 minutes. ? Remove the heat if your skin turns bright red. This is especially important if you are unable to feel pain, heat, or cold. You may have a greater risk of getting burned.  If directed, massage the affected area. Activity  Exercise as told by your health care provider. Gentle exercise is the best way to prevent or manage back pain.  Listen to your body when lifting. If lifting hurts, ask for help or bend your knees. This uses your leg muscles instead of your back muscles.  Squat down when picking up something from the floor. Do not bend over.  Only use bed rest for short periods as told by your health care provider. Bed rest should only be used for the most severe episodes of back pain. Standing, sitting, and lying down  Do  not stand in one place for long periods of time.  Use good posture when sitting. Make sure your head rests over your shoulders and is not hanging forward. Use a pillow on your lower back if necessary.  Try sleeping on your side, preferably the left side, with a pregnancy support pillow or 1-2 regular pillows between your legs. ? If you have back pain after a night's rest, your bed may be too soft. ? A firm mattress may provide more support for your back during pregnancy. General instructions  Do not wear high heels.  Eat a healthy diet. Try to gain weight within your health care provider's recommendations.  Use a maternity girdle, elastic sling, or back brace as told by your health care provider.  Take over-the-counter and prescription medicines only as told by your health care provider.  Work with a physical therapist or massage therapist to find ways to manage back pain. Acupuncture or massage therapy may be helpful.  Keep all follow-up visits as told by your health care provider. This is important. Contact a health care provider if:  Your back pain interferes with your daily activities.  You have increasing pain in other parts of your body. Get help right away if:  You develop numbness, tingling, weakness, or problems with the use of your arms or legs.  You develop severe back pain that is  not controlled with medicine.  You have a change in bowel or bladder control.  You develop shortness of breath, dizziness, or you faint.  You develop nausea, vomiting, or sweating.  You have back pain that is a rhythmic, cramping pain similar to labor pains. Labor pain is usually 1-2 minutes apart, lasts for about 1 minute, and involves a bearing down feeling or pressure in your pelvis.  You have back pain and your water breaks or you have vaginal bleeding.  You have back pain or numbness that travels down your leg.  Your back pain developed after you fell.  You develop pain on one  side of your back.  You see blood in your urine.  You develop skin blisters in the area of your back pain. Summary  Back pain may be caused by several factors that are related to changes during your pregnancy.  Follow instructions as told by your health care provider for managing pain, stiffness, and swelling.  Exercise as told by your health care provider. Gentle exercise is the best way to prevent or manage back pain.  Take over-the-counter and prescription medicines only as told by your health care provider.  Keep all follow-up visits as told by your health care provider. This is important. This information is not intended to replace advice given to you by your health care provider. Make sure you discuss any questions you have with your health care provider. Document Released: 10/23/2005 Document Revised: 12/31/2017 Document Reviewed: 12/31/2017 Elsevier Interactive Patient Education  2019 [employer].   Exercise During Pregnancy For people of all ages, exercise is an important part of being healthy. Exercise improves heart and lung function and helps to maintain strength, flexibility, and a healthy body weight. Exercise also boosts energy levels and elevates mood. For most women, maintaining an exercise routine throughout pregnancy is recommended. It is only on rare occasions and with certain medical conditions or pregnancy complications that women may be asked to limit or avoid exercise during pregnancy. What are some other benefits to exercising during pregnancy? Along with maintaining strength and flexibility, exercising throughout pregnancy can help to:  Keep strength in muscles that are very important during labor and childbirth.  Decrease low back pain during pregnancy.  Decrease the risk of developing gestational diabetes mellitus (GDM).  Improve blood sugar (glucose) control for women who have GDM.  Decrease the risk of developing preeclampsia. This is a serious  condition that causes high blood pressure along with other symptoms, such as swelling and headaches.  Decrease the risk of cesarean delivery.  Speed up the recovery after giving birth. How often should I exercise? Unless your health care provider gives you different instructions, you should try to exercise on most days or all days of the week. In general, try to exercise with moderate intensity for about 150 minutes per week. This can be spread out across several days, such as exercising for 30 minutes per day on 5 days of each week. You can tell that you are exercising at a moderate intensity if you have a higher heart rate and faster breathing, but you are still able to hold a conversation. What types of moderate-intensity exercise are recommended during pregnancy? There are many types of exercise that are safe for you to do during pregnancy. Unless your health care provider gives you different instructions, do a variety of exercises that safely increase your heart and breathing (cardiopulmonary) rates and help you to build and maintain muscle strength (strength training). You  should always be able to talk in full sentences while exercising during pregnancy. Some examples of exercising that is safe to do during pregnancy include:  Brisk walking or hiking.  Swimming.  Water aerobics.  Riding a stationary bike.  Strength training.  Modified yoga or Pilates. Tell your instructor that you are pregnant. Avoid overstretching and avoid lying on your back for long periods of time.  Running or jogging. Only choose this type of exercise if: ? You ran or jogged regularly before your pregnancy. ? You can run or jog and still talk in complete sentences. What types of exercise should I not do during pregnancy? Depending on your level of fitness and whether you exercised regularly before your pregnancy, you may be advised to limit vigorous-intensity exercise during your pregnancy. You can tell that you  are exercising at a vigorous intensity if you are breathing much harder and faster and cannot hold a conversation while exercising. Some examples of exercising that you should avoid during pregnancy include:  Contact sports.  Activities that place you at risk for falling on or being hit in the belly, such as downhill skiing, water skiing, surfing, rock climbing, cycling, gymnastics, and horseback riding.  Scuba diving.  Sky diving.  Yoga or Pilates in a room that is heated to extreme temperatures ("hot yoga" or "hot Pilates").  Jogging or running, unless you ran or jogged regularly before your pregnancy. While jogging or running, you should always be able to talk in full sentences. Do not run or jog so vigorously that you are unable to have a conversation.  If you are not used to exercising at elevation (more than 6,000 feet above sea level), do not do so during your pregnancy. When should I avoid exercising during pregnancy? Certain medical conditions can make it unsafe to exercise during pregnancy, or they may increase your risk of miscarriage or early labor and birth. Some of these conditions include:  Some types of heart disease.  Some types of lung disease.  Placenta previa. This is when the placenta partially or completely covers the opening of the uterus (cervix).  Frequent bleeding from the vagina during your pregnancy.  Incompetent cervix. This is when your cervix does not remain as tightly closed during pregnancy as it should.  Premature labor.  Ruptured membranes. This is when the protective sac (amniotic sac) opens up and amniotic fluid leaks from your vagina.  Severely low blood count (anemia).  Preeclampsia or pregnancy-caused high blood pressure.  Carrying more than one baby (multiple gestation) and having an additional risk of early labor.  Poorly controlled diabetes.  Being severely underweight or severely overweight.  Intrauterine growth restriction. This  is when your baby's growth and development during pregnancy are slower than expected.  Other medical conditions. Ask your health care provider if any apply to you. What else should I know about exercising during pregnancy? You should take these precautions while exercising during pregnancy:  Avoid overheating. ? Wear loose-fitting, breathable clothes. ? Do not exercise in very high temperatures.  Avoid dehydration. Drink enough water before, during, and after exercise to keep your urine clear or pale yellow.  Avoid overstretching. Because of hormone changes during pregnancy, it is easy to overstretch muscles, tendons, and ligaments during pregnancy.  Start slowly and ask your health care provider to recommend types of exercise that are safe for you, if exercising regularly is new for you. Pregnancy is not a time for exercising to lose weight. When should I seek medical  care? You should stop exercising and call your health care provider if you have any unusual symptoms, such as:  Mild uterine contractions or abdominal cramping.  Dizziness that does not improve with rest. When should I seek immediate medical care? You should stop exercising and call your local emergency services (911 in the [place] if you have any unusual symptoms, such as:  Sudden, severe pain in your low back or your belly.  Uterine contractions or abdominal cramping that do not improve with rest.  Chest pain.  Bleeding or fluid leaking from your vagina.  Shortness of breath. This information is not intended to replace advice given to you by your health care provider. Make sure you discuss any questions you have with your health care provider. Document Released: 07/15/2005 Document Revised: 12/13/2015 Document Reviewed: 09/22/2014 Elsevier Interactive Patient Education  2019 [employer] Inc[employer]   Vaginal Delivery  Vaginal delivery means that you give birth by pushing your baby out of your birth canal (vagina). A  team of health care providers will help you before, during, and after vaginal delivery. Birth experiences are unique for every woman and every pregnancy, and birth experiences vary depending on where you choose to give birth. What happens when I arrive at the birth center or hospital? Once you are in labor and have been admitted into the hospital or birth center, your health care provider may:  Review your pregnancy history and any concerns that you have.  Insert an IV into one of your veins. This may be used to give you fluids and medicines.  Check your blood pressure, pulse, temperature, and heart rate (vital signs).  Check whether your bag of water (amniotic sac) has broken (ruptured).  Talk with you about your birth plan and discuss pain control options. Monitoring Your health care provider may monitor your contractions (uterine monitoring) and your baby's heart rate (fetal monitoring). You may need to be monitored:  Often, but not continuously (intermittently).  All the time or for long periods at a time (continuously). Continuous monitoring may be needed if: ? You are taking certain medicines, such as medicine to relieve pain or make your contractions stronger. ? You have pregnancy or labor complications. Monitoring may be done by:  Placing a special stethoscope or a handheld monitoring device on your abdomen to check your baby's heartbeat and to check for contractions.  Placing monitors on your abdomen (external monitors) to record your baby's heartbeat and the frequency and length of contractions.  Placing monitors inside your uterus through your vagina (internal monitors) to record your baby's heartbeat and the frequency, length, and strength of your contractions. Depending on the type of monitor, it may remain in your uterus or on your baby's head until birth.  Telemetry. This is a type of continuous monitoring that can be done with external or internal monitors. Instead of  having to stay in bed, you are able to move around during telemetry. Physical exam Your health care provider may perform frequent physical exams. This may include:  Checking how and where your baby is positioned in your uterus.  Checking your cervix to determine: ? Whether it is thinning out (effacing). ? Whether it is opening up (dilating). What happens during labor and delivery?  Normal labor and delivery is divided into the following three stages: Stage 1  This is the longest stage of labor.  This stage can last for hours or days.  Throughout this stage, you will feel contractions. Contractions generally feel mild, infrequent, and  irregular at first. They get stronger, more frequent (about every 2-3 minutes), and more regular as you move through this stage.  This stage ends when your cervix is completely dilated to 4 inches (10 cm) and completely effaced. Stage 2  This stage starts once your cervix is completely effaced and dilated and lasts until the delivery of your baby.  This stage may last from 20 minutes to 2 hours.  This is the stage where you will feel an urge to push your baby out of your vagina.  You may feel stretching and burning pain, especially when the widest part of your baby's head passes through the vaginal opening (crowning).  Once your baby is delivered, the umbilical cord will be clamped and cut. This usually occurs after waiting a period of 1-2 minutes after delivery.  Your baby will be placed on your bare chest (skin-to-skin contact) in an upright position and covered with a warm blanket. Watch your baby for feeding cues, like rooting or sucking, and help the baby to your breast for his or her first feeding. Stage 3  This stage starts immediately after the birth of your baby and ends after you deliver the placenta.  This stage may take anywhere from 5 to 30 minutes.  After your baby has been delivered, you will feel contractions as your body expels  the placenta and your uterus contracts to control bleeding. What can I expect after labor and delivery?  After labor is over, you and your baby will be monitored closely until you are ready to go home to ensure that you are both healthy. Your health care team will teach you how to care for yourself and your baby.  You and your baby will stay in the same room (rooming in) during your hospital stay. This will encourage early bonding and successful breastfeeding.  You may continue to receive fluids and medicines through an IV.  Your uterus will be checked and massaged regularly (fundal massage).  You will have some soreness and pain in your abdomen, vagina, and the area of skin between your vaginal opening and your anus (perineum).  If an incision was made near your vagina (episiotomy) or if you had some vaginal tearing during delivery, cold compresses may be placed on your episiotomy or your tear. This helps to reduce pain and swelling.  You may be given a squirt bottle to use instead of wiping when you go to the bathroom. To use the squirt bottle, follow these steps: ? Before you urinate, fill the squirt bottle with warm water. Do not use hot water. ? After you urinate, while you are sitting on the toilet, use the squirt bottle to rinse the area around your urethra and vaginal opening. This rinses away any urine and blood. ? Fill the squirt bottle with clean water every time you use the bathroom.  It is normal to have vaginal bleeding after delivery. Wear a sanitary pad for vaginal bleeding and discharge. Summary  Vaginal delivery means that you will give birth by pushing your baby out of your birth canal (vagina).  Your health care provider may monitor your contractions (uterine monitoring) and your baby's heart rate (fetal monitoring).  Your health care provider may perform a physical exam.  Normal labor and delivery is divided into three stages.  After labor is over, you and your  baby will be monitored closely until you are ready to go home. This information is not intended to replace advice given to you by  your health care provider. Make sure you discuss any questions you have with your health care provider. Document Released: 04/23/2008 Document Revised: 08/19/2017 Document Reviewed: 08/19/2017 Elsevier Interactive Patient Education  2019 [employer] Inc[employer]   WHAT OB PATIENTS CAN EXPECT   Confirmation of pregnancy and ultrasound ordered if medically indicated-[redacted] weeks gestation  New OB (NOB) intake with nurse and New OB (NOB) labs- [redacted] weeks gestation  New OB (NOB) physical examination with provider- 11/[redacted] weeks gestation  Flu vaccine-[redacted] weeks gestation  Anatomy scan-[redacted] weeks gestation  Glucose tolerance test, blood work to test for anemia, T-dap vaccine-[redacted] weeks gestation  Vaginal swabs/cultures-STD/Group B strep-[redacted] weeks gestation  Appointments every 4 weeks until 28 weeks  Every 2 weeks from 28 weeks until 36 weeks  Weekly visits from 36 weeks until delivery    Third Trimester of Pregnancy  The third trimester is from week 28 through week 40 (months 7 through 9). This trimester is when your unborn baby (fetus) is growing very fast. At the end of the ninth month, the unborn baby is about 20 inches in length. It weighs about 6-10 pounds. Follow these instructions at home: Medicines  Take over-the-counter and prescription medicines only as told by your doctor. Some medicines are safe and some medicines are not safe during pregnancy.  Take a prenatal vitamin that contains at least 600 micrograms (mcg) of folic acid.  If you have trouble pooping (constipation), take medicine that will make your stool soft (stool softener) if your doctor approves. Eating and drinking   Eat regular, healthy meals.  Avoid raw meat and uncooked cheese.  If you get low calcium from the food you eat, talk to your doctor about taking a daily calcium supplement.  Eat four  or five small meals rather than three large meals a day.  Avoid foods that are high in fat and sugars, such as fried and sweet foods.  To prevent constipation: ? Eat foods that are high in fiber, like fresh fruits and vegetables, whole grains, and beans. ? Drink enough fluids to keep your pee (urine) clear or pale yellow. Activity  Exercise only as told by your doctor. Stop exercising if you start to have cramps.  Avoid heavy lifting, wear low heels, and sit up straight.  Do not exercise if it is too hot, too humid, or if you are in a place of great height (high altitude).  You may continue to have sex unless your doctor tells you not to. Relieving pain and discomfort  Wear a good support bra if your breasts are tender.  Take frequent breaks and rest with your legs raised if you have leg cramps or low back pain.  Take warm water baths (sitz baths) to soothe pain or discomfort caused by hemorrhoids. Use hemorrhoid cream if your doctor approves.  If you develop puffy, bulging veins (varicose veins) in your legs: ? Wear support hose or compression stockings as told by your doctor. ? Raise (elevate) your feet for 15 minutes, 3-4 times a day. ? Limit salt in your food. Safety  Wear your seat belt when driving.  Make a list of emergency phone numbers, including numbers for family, friends, the hospital, and police and fire departments. Preparing for your baby's arrival To prepare for the arrival of your baby:  Take prenatal classes.  Practice driving to the hospital.  Visit the hospital and tour the maternity area.  Talk to your work about taking leave once the baby comes.  Pack your hospital bag.  Prepare the baby's room.  Go to your doctor visits.  Buy a rear-facing car seat. Learn how to install it in your car. General instructions  Do not use hot tubs, steam rooms, or saunas.  Do not use any products that contain nicotine or tobacco, such as cigarettes and  e-cigarettes. If you need help quitting, ask your doctor.  Do not drink alcohol.  Do not douche or use tampons or scented sanitary pads.  Do not cross your legs for long periods of time.  Do not travel for long distances unless you must. Only do so if your doctor says it is okay.  Visit your dentist if you have not gone during your pregnancy. Use a soft toothbrush to brush your teeth. Be gentle when you floss.  Avoid cat litter boxes and soil used by cats. These carry germs that can cause birth defects in the baby and can cause a loss of your baby (miscarriage) or stillbirth.  Keep all your prenatal visits as told by your doctor. This is important. Contact a doctor if:  You are not sure if you are in labor or if your water has broken.  You are dizzy.  You have mild cramps or pressure in your lower belly.  You have a nagging pain in your belly area.  You continue to feel sick to your stomach, you throw up, or you have watery poop.  You have bad smelling fluid coming from your vagina.  You have pain when you pee. Get help right away if:  You have a fever.  You are leaking fluid from your vagina.  You are spotting or bleeding from your vagina.  You have severe belly cramps or pain.  You lose or gain weight quickly.  You have trouble catching your breath and have chest pain.  You notice sudden or extreme puffiness (swelling) of your face, hands, ankles, feet, or legs.  You have not felt the baby move in over an hour.  You have severe headaches that do not go away with medicine.  You have trouble seeing.  You are leaking, or you are having a gush of fluid, from your vagina before you are 37 weeks.  You have regular belly spasms (contractions) before you are 37 weeks. Summary  The third trimester is from week 28 through week 40 (months 7 through 9). This time is when your unborn baby is growing very fast.  Follow your doctor's advice about medicine, food, and  activity.  Get ready for the arrival of your baby by taking prenatal classes, getting all the baby items ready, preparing the baby's room, and visiting your doctor to be checked.  Get help right away if you are bleeding from your vagina, or you have chest pain and trouble catching your breath, or if you have not felt your baby move in over an hour. This information is not intended to replace advice given to you by your health care provider. Make sure you discuss any questions you have with your health care provider. Document Released: 10/09/2009 Document Revised: 08/20/2016 Document Reviewed: 08/20/2016 Elsevier Interactive Patient Education  2019 [employer]

## 2018-10-09 NOTE — Progress Notes (Signed)
ROB-Reports intermittent back pain. Discussed home treatment measures, handouts given. Questions answered regarding exercise in pregnancy. Anticipatory guidance regarding course of prenatal care. Reviewed red flag symptoms and when to call. RTC x 2 weeks for 36 week cultures and ROB or sooner if needed.

## 2018-10-16 ENCOUNTER — Other Ambulatory Visit: Payer: Self-pay | Admitting: Certified Nurse Midwife

## 2018-10-26 ENCOUNTER — Encounter: Payer: Medicaid Other | Admitting: Certified Nurse Midwife

## 2018-10-27 NOTE — Progress Notes (Signed)
Coronavirus (COVID-19) Are you at risk?  Are you at risk for the Coronavirus (COVID-19)?  To be considered HIGH RISK for Coronavirus (COVID-19), you have to meet the following criteria:  . Traveled to [place], [place], [place], [place] or [place]; or in the [place] to [place], [place], [place], or [place]; and have fever, cough, and shortness of breath within the last 2 weeks of travel OR . Been in close contact with a person diagnosed with COVID-19 within the last 2 weeks and have fever, cough, and shortness of breath . IF YOU DO NOT MEET THESE CRITERIA, YOU ARE CONSIDERED LOW RISK FOR COVID-19.  What to do if you are HIGH RISK for COVID-19?  . If you are having a medical emergency, call 911. . Seek medical care right away. Before you go to a doctor's office, urgent care or emergency department, call ahead and tell them about your recent travel, contact with someone diagnosed with COVID-19, and your symptoms. You should receive instructions from your physician's office regarding next steps of care.  . When you arrive at healthcare provider, tell the healthcare staff immediately you have returned from visiting [place], [place], [place], [place] or [place]; or traveled in the [place] to [place], [place], [place], or [place]; in the last two weeks or you have been in close contact with a person diagnosed with COVID-19 in the last 2 weeks.   . Tell the health care staff about your symptoms: fever, cough and shortness of breath. . After you have been seen by a medical provider, you will be either: o Tested for (COVID-19) and discharged home on quarantine except to seek medical care if symptoms worsen, and asked to  - Stay home and avoid contact with others until you get your results (4-5 days)  - Avoid travel on public transportation if possible (such as bus, train, or airplane) or o Sent to the Emergency Department by EMS for evaluation, COVID-19 testing, and possible  admission depending on your condition and test results.  What to do if you are LOW RISK for COVID-19?  Reduce your risk of any infection by using the same precautions used for avoiding the common cold or flu:  . Wash your hands often with soap and warm water for at least 20 seconds.  If soap and water are not readily available, use an alcohol-based hand sanitizer with at least 60% alcohol.  . If coughing or sneezing, cover your mouth and nose by coughing or sneezing into the elbow areas of your shirt or coat, into a tissue or into your sleeve (not your hands). . Avoid shaking hands with others and consider head nods or verbal greetings only. . Avoid touching your eyes, nose, or mouth with unwashed hands.  . Avoid close contact with people who are sick. . Avoid places or events with large numbers of people in one location, like concerts or sporting events. . Carefully consider travel plans you have or are making. . If you are planning any travel outside or inside the [place], visit the CDC's Travelers' Health webpage for the latest health notices. . If you have some symptoms but not all symptoms, continue to monitor at home and seek medical attention if your symptoms worsen. . If you are having a medical emergency, call 911.  Spoke with pt denies any sx.  , CMA   ADDITIONAL HEALTHCARE OPTIONS FOR PATIENTS  [facility] / e-Visit: [email or web address]           [facility]: [phone]  [facility]: [phone]                   [facility]: [phone]  

## 2018-10-28 ENCOUNTER — Ambulatory Visit (INDEPENDENT_AMBULATORY_CARE_PROVIDER_SITE_OTHER): Payer: Medicaid Other | Admitting: Certified Nurse Midwife

## 2018-10-28 ENCOUNTER — Other Ambulatory Visit: Payer: Self-pay

## 2018-10-28 ENCOUNTER — Encounter: Payer: Self-pay | Admitting: Certified Nurse Midwife

## 2018-10-28 VITALS — BP 94/60 | HR 83 | Wt 199.1 lb

## 2018-10-28 DIAGNOSIS — Z3493 Encounter for supervision of normal pregnancy, unspecified, third trimester: Secondary | ICD-10-CM

## 2018-10-28 LAB — POCT URINALYSIS DIPSTICK OB
Bilirubin, UA: NEGATIVE
Blood, UA: NEGATIVE
Glucose, UA: NEGATIVE
Ketones, UA: NEGATIVE
Leukocytes, UA: NEGATIVE
Nitrite, UA: NEGATIVE
POC,PROTEIN,UA: NEGATIVE
Spec Grav, UA: 1.025 (ref 1.010–1.025)
Urobilinogen, UA: 0.2 E.U./dL
pH, UA: 6 (ref 5.0–8.0)

## 2018-10-28 MED ORDER — VALACYCLOVIR HCL 1 G PO TABS: 1000.0000 mg | ORAL_TABLET | Freq: Every day | ORAL | 1 refills | Status: DC

## 2018-10-28 NOTE — Patient Instructions (Signed)
Group B Streptococcus Infection During Pregnancy  Group B Streptococcus (GBS) is a type of bacteria (Streptococcus agalactiae) that is often found in healthy people, commonly in the rectum, vagina, and intestines. In people who are healthy and not pregnant, the bacteria rarely cause serious illness or complications. However, women who test positive for GBS during pregnancy can pass the bacteria to their baby during childbirth, which can cause serious infection in the baby after birth. Women with GBS may also have infections during their pregnancy or immediately after childbirth, such as such as urinary tract infections (UTIs) or infections of the uterus (uterine infections). Having GBS also increases a woman's risk of complications during pregnancy, such as early (preterm) labor or delivery, miscarriage, or stillbirth. Routine testing (screening) for GBS is recommended for all pregnant women. What increases the risk? You may have a higher risk for GBS infection during pregnancy if you had one during a past pregnancy. What are the signs or symptoms? In most cases, GBS infection does not cause symptoms in pregnant women. Signs and symptoms of a possible GBS-related infection may include:  Labor starting before the 37th week of pregnancy.  A UTI or bladder infection, which may cause: ? Fever. ? Pain or burning during urination. ? Frequent urination.  Fever during labor, along with: ? Bad-smelling discharge. ? Uterine tenderness. ? Rapid heartbeat in the mother, baby, or both. Rare but serious symptoms of a possible GBS-related infection in women include:  Blood infection (septicemia). This may cause fever, chills, or confusion.  Lung infection (pneumonia). This may cause fever, chills, cough, rapid breathing, difficulty breathing, or chest pain.  Bone, joint, skin, or soft tissue infection. How is this diagnosed? You may be screened for GBS between week 35 and week 37 of your pregnancy. If  you have symptoms of preterm labor, you may be screened earlier. This condition is diagnosed based on lab test results from:  A swab of fluid from the vagina and rectum.  A urine sample. How is this treated? This condition is treated with antibiotic medicine. When you go into labor, or as soon as your water breaks (your membranes rupture), you will be given antibiotics through an IV tube. Antibiotics will continue until after you give birth. If you are having a cesarean delivery, you do not need antibiotics unless your membranes have already ruptured. Follow these instructions at home:  Take over-the-counter and prescription medicines only as told by your health care provider.  Take your antibiotic medicine as told by your health care provider. Do not stop taking the antibiotic even if you start to feel better.  Keep all pre-birth (prenatal) visits and follow-up visits as told by your health care provider. This is important. Contact a health care provider if:  You have pain or burning when you urinate.  You have to urinate frequently.  You have a fever or chills.  You develop a bad-smelling vaginal discharge. Get help right away if:  Your membranes rupture.  You go into labor.  You have severe pain in your abdomen.  You have difficulty breathing.  You have chest pain. This information is not intended to replace advice given to you by your health care provider. Make sure you discuss any questions you have with your health care provider. Document Released: 10/22/2007 Document Revised: 02/09/2016 Document Reviewed: 02/08/2016 Elsevier Interactive Patient Education  2019 Elsevier Inc.  

## 2018-10-28 NOTE — Progress Notes (Signed)
ROB doing well. Feels good movement. GBS and cultures today. Pt instructed to start HSV prophylaxis , orders placed for valtrex. SVE. 1/50/-2. Reviewed labor precautions. Follow up 2 wks. Or sooner if needed.   Doreene Burke, CNM

## 2018-10-30 LAB — STREP GP B NAA: Strep Gp B NAA: NEGATIVE

## 2018-10-30 LAB — GC/CHLAMYDIA PROBE AMP
Chlamydia trachomatis, NAA: NEGATIVE
Neisseria Gonorrhoeae by PCR: NEGATIVE

## 2018-11-09 ENCOUNTER — Encounter: Payer: Self-pay | Admitting: *Deleted

## 2018-11-10 ENCOUNTER — Ambulatory Visit (INDEPENDENT_AMBULATORY_CARE_PROVIDER_SITE_OTHER): Payer: Medicaid Other | Admitting: Obstetrics and Gynecology

## 2018-11-10 ENCOUNTER — Other Ambulatory Visit: Payer: Self-pay

## 2018-11-10 ENCOUNTER — Telehealth: Payer: Self-pay

## 2018-11-10 VITALS — BP 108/69 | HR 94 | Wt 203.4 lb

## 2018-11-10 DIAGNOSIS — Z3493 Encounter for supervision of normal pregnancy, unspecified, third trimester: Secondary | ICD-10-CM

## 2018-11-10 LAB — POCT URINALYSIS DIPSTICK OB
Bilirubin, UA: NEGATIVE
Blood, UA: NEGATIVE
Glucose, UA: NEGATIVE
Ketones, UA: NEGATIVE
Leukocytes, UA: NEGATIVE
Nitrite, UA: NEGATIVE
POC,PROTEIN,UA: NEGATIVE
Spec Grav, UA: 1.02 (ref 1.010–1.025)
Urobilinogen, UA: 0.2 E.U./dL
pH, UA: 6 (ref 5.0–8.0)

## 2018-11-10 NOTE — Progress Notes (Signed)
ROB- doing well, discussed covid-19 restrictions, labor precautions and Breast feeding, labor precautions,plans [name] for labor support (mom if he is working). Lives with parents, and younger sister(who has down's)

## 2018-11-10 NOTE — Telephone Encounter (Signed)
Coronavirus (COVID-19) Are you at risk?  Are you at risk for the Coronavirus (COVID-19)?  To be considered HIGH RISK for Coronavirus (COVID-19), you have to meet the following criteria:  . Traveled to [place], [place], [place], [place] or [place]; or in the [place] to [place], [place], [place], or [place]; and have fever, cough, and shortness of breath within the last 2 weeks of travel OR . Been in close contact with a person diagnosed with COVID-19 within the last 2 weeks and have fever, cough, and shortness of breath . IF YOU DO NOT MEET THESE CRITERIA, YOU ARE CONSIDERED LOW RISK FOR COVID-19.  What to do if you are HIGH RISK for COVID-19?  . If you are having a medical emergency, call 911. . Seek medical care right away. Before you go to a doctor's office, urgent care or emergency department, call ahead and tell them about your recent travel, contact with someone diagnosed with COVID-19, and your symptoms. You should receive instructions from your physician's office regarding next steps of care.  . When you arrive at healthcare provider, tell the healthcare staff immediately you have returned from visiting [place], [place], [place], [place] or [place]; or traveled in the [place] to [place], [place], [place], or [place]; in the last two weeks or you have been in close contact with a person diagnosed with COVID-19 in the last 2 weeks.   . Tell the health care staff about your symptoms: fever, cough and shortness of breath. . After you have been seen by a medical provider, you will be either: o Tested for (COVID-19) and discharged home on quarantine except to seek medical care if symptoms worsen, and asked to  - Stay home and avoid contact with others until you get your results (4-5 days)  - Avoid travel on public transportation if possible (such as bus, train, or airplane) or o Sent to the Emergency Department by EMS for evaluation, COVID-19 testing, and possible  admission depending on your condition and test results.  What to do if you are LOW RISK for COVID-19?  Reduce your risk of any infection by using the same precautions used for avoiding the common cold or flu:  . Wash your hands often with soap and warm water for at least 20 seconds.  If soap and water are not readily available, use an alcohol-based hand sanitizer with at least 60% alcohol.  . If coughing or sneezing, cover your mouth and nose by coughing or sneezing into the elbow areas of your shirt or coat, into a tissue or into your sleeve (not your hands). . Avoid shaking hands with others and consider head nods or verbal greetings only. . Avoid touching your eyes, nose, or mouth with unwashed hands.  . Avoid close contact with people who are sick. . Avoid places or events with large numbers of people in one location, like concerts or sporting events. . Carefully consider travel plans you have or are making. . If you are planning any travel outside or inside the [place], visit the CDC's Travelers' Health webpage for the latest health notices. . If you have some symptoms but not all symptoms, continue to monitor at home and seek medical attention if your symptoms worsen. . If you are having a medical emergency, call 911.   ADDITIONAL HEALTHCARE OPTIONS FOR PATIENTS  [facility] / e-Visit: [email or web address]         [facility]: [phone]  [facility]: [phone]                   [facility]: [phone]   Prescreened. Neg .cm 

## 2018-11-15 ENCOUNTER — Other Ambulatory Visit: Payer: Self-pay

## 2018-11-15 ENCOUNTER — Inpatient Hospital Stay: Payer: Medicaid Other | Admitting: Anesthesiology

## 2018-11-15 ENCOUNTER — Inpatient Hospital Stay
Admission: EM | Admit: 2018-11-15 | Discharge: 2018-11-17 | DRG: 806 | Disposition: A | Discharge status: Home / Self Care | Payer: Medicaid Other | Attending: Certified Nurse Midwife | Admitting: Certified Nurse Midwife

## 2018-11-15 DIAGNOSIS — O99214 Obesity complicating childbirth: Secondary | ICD-10-CM | POA: Diagnosis present

## 2018-11-15 DIAGNOSIS — O26893 Other specified pregnancy related conditions, third trimester: Secondary | ICD-10-CM | POA: Diagnosis present

## 2018-11-15 DIAGNOSIS — Z3A39 39 weeks gestation of pregnancy: Secondary | ICD-10-CM | POA: Diagnosis not present

## 2018-11-15 DIAGNOSIS — O99324 Drug use complicating childbirth: Secondary | ICD-10-CM | POA: Diagnosis present

## 2018-11-15 DIAGNOSIS — O99210 Obesity complicating pregnancy, unspecified trimester: Secondary | ICD-10-CM | POA: Diagnosis present

## 2018-11-15 DIAGNOSIS — J45909 Unspecified asthma, uncomplicated: Secondary | ICD-10-CM | POA: Diagnosis present

## 2018-11-15 DIAGNOSIS — O4292 Full-term premature rupture of membranes, unspecified as to length of time between rupture and onset of labor: Principal | ICD-10-CM | POA: Diagnosis present

## 2018-11-15 DIAGNOSIS — O4202 Full-term premature rupture of membranes, onset of labor within 24 hours of rupture: Secondary | ICD-10-CM | POA: Diagnosis not present

## 2018-11-15 DIAGNOSIS — F418 Other specified anxiety disorders: Secondary | ICD-10-CM | POA: Diagnosis present

## 2018-11-15 DIAGNOSIS — A6000 Herpesviral infection of urogenital system, unspecified: Secondary | ICD-10-CM | POA: Diagnosis present

## 2018-11-15 DIAGNOSIS — T7491XA Unspecified adult maltreatment, confirmed, initial encounter: Secondary | ICD-10-CM

## 2018-11-15 DIAGNOSIS — O9832 Other infections with a predominantly sexual mode of transmission complicating childbirth: Secondary | ICD-10-CM | POA: Diagnosis present

## 2018-11-15 DIAGNOSIS — F1290 Cannabis use, unspecified, uncomplicated: Secondary | ICD-10-CM | POA: Diagnosis present

## 2018-11-15 DIAGNOSIS — E669 Obesity, unspecified: Secondary | ICD-10-CM | POA: Diagnosis present

## 2018-11-15 DIAGNOSIS — O99344 Other mental disorders complicating childbirth: Secondary | ICD-10-CM | POA: Diagnosis present

## 2018-11-15 DIAGNOSIS — Z87891 Personal history of nicotine dependence: Secondary | ICD-10-CM

## 2018-11-15 DIAGNOSIS — O9081 Anemia of the puerperium: Secondary | ICD-10-CM | POA: Diagnosis not present

## 2018-11-15 DIAGNOSIS — Z8659 Personal history of other mental and behavioral disorders: Secondary | ICD-10-CM

## 2018-11-15 DIAGNOSIS — O9952 Diseases of the respiratory system complicating childbirth: Secondary | ICD-10-CM | POA: Diagnosis present

## 2018-11-15 HISTORY — DX: Unspecified adult maltreatment, confirmed, initial encounter: T74.91XA

## 2018-11-15 LAB — URINE DRUG SCREEN, QUALITATIVE (ARMC ONLY)
Amphetamines, Ur Screen: NOT DETECTED
Barbiturates, Ur Screen: NOT DETECTED
Benzodiazepine, Ur Scrn: NOT DETECTED
Cannabinoid 50 Ng, Ur ~~LOC~~: NOT DETECTED
Cocaine Metabolite,Ur ~~LOC~~: NOT DETECTED
MDMA (Ecstasy)Ur Screen: NOT DETECTED
Methadone Scn, Ur: NOT DETECTED
Opiate, Ur Screen: NOT DETECTED
Phencyclidine (PCP) Ur S: NOT DETECTED
Tricyclic, Ur Screen: NOT DETECTED

## 2018-11-15 LAB — CBC
HCT: 36.8 % (ref 36.0–46.0)
Hemoglobin: 11.5 g/dL — ABNORMAL LOW (ref 12.0–15.0)
MCH: 26 pg (ref 26.0–34.0)
MCHC: 31.3 g/dL (ref 30.0–36.0)
MCV: 83.3 fL (ref 80.0–100.0)
Platelets: 235 10*3/uL (ref 150–400)
RBC: 4.42 MIL/uL (ref 3.87–5.11)
RDW: 15.4 % (ref 11.5–15.5)
WBC: 8.8 10*3/uL (ref 4.0–10.5)
nRBC: 0 % (ref 0.0–0.2)

## 2018-11-15 LAB — TYPE AND SCREEN
ABO/RH(D): O POS
Antibody Screen: NEGATIVE

## 2018-11-15 MED ORDER — ONDANSETRON HCL 4 MG/2ML IJ SOLN: 4.0000 mg | Freq: Four times a day (QID) | INTRAMUSCULAR | Status: DC | PRN

## 2018-11-15 MED ORDER — ACETAMINOPHEN 325 MG PO TABS: 650.0000 mg | ORAL_TABLET | ORAL | Status: DC | PRN

## 2018-11-15 MED ORDER — BUTORPHANOL TARTRATE 1 MG/ML IJ SOLN
1.0000 mg | INTRAMUSCULAR | Status: DC | PRN
  Administered 2018-11-15: 1 mg via INTRAVENOUS
  Filled 2018-11-15: qty 1

## 2018-11-15 MED ORDER — LIDOCAINE HCL (PF) 1 % IJ SOLN
INTRAMUSCULAR | Status: DC | PRN
  Administered 2018-11-15: 1 mL
  Administered 2018-11-15: 2 mL

## 2018-11-15 MED ORDER — FENTANYL 2.5 MCG/ML W/ROPIVACAINE 0.15% IN NS 100 ML EPIDURAL (ARMC)
EPIDURAL | Status: AC
  Filled 2018-11-15: qty 100

## 2018-11-15 MED ORDER — OXYTOCIN BOLUS FROM INFUSION: 500.0000 mL | Freq: Once | INTRAVENOUS | Status: DC

## 2018-11-15 MED ORDER — TERBUTALINE SULFATE 1 MG/ML IJ SOLN: 0.2500 mg | Freq: Once | INTRAMUSCULAR | Status: DC | PRN

## 2018-11-15 MED ORDER — LACTATED RINGERS AMNIOINFUSION
INTRAVENOUS | Status: DC
  Administered 2018-11-15: via INTRAUTERINE
  Filled 2018-11-15 (×3): qty 1000

## 2018-11-15 MED ORDER — LACTATED RINGERS IV SOLN
500.0000 mL | INTRAVENOUS | Status: DC | PRN
  Administered 2018-11-15: 500 mL via INTRAVENOUS

## 2018-11-15 MED ORDER — ZOLPIDEM TARTRATE 5 MG PO TABS: 5.0000 mg | ORAL_TABLET | Freq: Every evening | ORAL | Status: DC | PRN

## 2018-11-15 MED ORDER — AMMONIA AROMATIC IN INHA
RESPIRATORY_TRACT | Status: AC
  Administered 2018-11-16: 06:00:00
  Filled 2018-11-15: qty 10

## 2018-11-15 MED ORDER — OXYTOCIN 40 UNITS IN NORMAL SALINE INFUSION - SIMPLE MED
1.0000 m[IU]/min | INTRAVENOUS | Status: DC
  Administered 2018-11-15: 2 m[IU]/min via INTRAVENOUS
  Filled 2018-11-15: qty 1000

## 2018-11-15 MED ORDER — OXYTOCIN 10 UNIT/ML IJ SOLN
INTRAMUSCULAR | Status: AC
  Filled 2018-11-15: qty 2

## 2018-11-15 MED ORDER — OXYTOCIN 40 UNITS IN NORMAL SALINE INFUSION - SIMPLE MED
2.5000 [IU]/h | INTRAVENOUS | Status: DC
  Filled 2018-11-15: qty 1000

## 2018-11-15 MED ORDER — OXYCODONE-ACETAMINOPHEN 5-325 MG PO TABS: 2.0000 | ORAL_TABLET | ORAL | Status: DC | PRN

## 2018-11-15 MED ORDER — FENTANYL 2.5 MCG/ML W/ROPIVACAINE 0.15% IN NS 100 ML EPIDURAL (ARMC)
EPIDURAL | Status: DC | PRN
  Administered 2018-11-15: 12 mL/h via EPIDURAL

## 2018-11-15 MED ORDER — SOD CITRATE-CITRIC ACID 500-334 MG/5ML PO SOLN: 30.0000 mL | ORAL | Status: DC | PRN

## 2018-11-15 MED ORDER — LIDOCAINE HCL (PF) 1 % IJ SOLN: 30.0000 mL | INTRAMUSCULAR | Status: DC | PRN

## 2018-11-15 MED ORDER — MISOPROSTOL 200 MCG PO TABS
ORAL_TABLET | ORAL | Status: AC
  Filled 2018-11-15: qty 4

## 2018-11-15 MED ORDER — LACTATED RINGERS IV SOLN
INTRAVENOUS | Status: DC
  Administered 2018-11-15: 19:00:00 via INTRAVENOUS

## 2018-11-15 MED ORDER — LIDOCAINE HCL (PF) 1 % IJ SOLN
INTRAMUSCULAR | Status: AC
  Filled 2018-11-15: qty 30

## 2018-11-15 MED ORDER — SODIUM CHLORIDE 0.9 % IV SOLN
INTRAVENOUS | Status: DC | PRN
  Administered 2018-11-15 (×3): 5 mL via EPIDURAL

## 2018-11-15 MED ORDER — OXYCODONE-ACETAMINOPHEN 5-325 MG PO TABS: 1.0000 | ORAL_TABLET | ORAL | Status: DC | PRN

## 2018-11-15 NOTE — Anesthesia Preprocedure Evaluation (Signed)
Anesthesia Evaluation  Patient identified by MRN, date of birth, ID band Patient awake    Reviewed: Allergy & Precautions, H&P , NPO status , Patient's Chart, lab work & pertinent test results  Airway Mallampati: II  TM Distance: >3 FB Neck ROM: full    Dental  (+) Teeth Intact   Pulmonary asthma (childhood) , former smoker,           Cardiovascular Exercise Tolerance: Good (-) hypertensionnegative cardio ROS       Neuro/Psych PSYCHIATRIC DISORDERS Depression negative neurological ROS     GI/Hepatic negative GI ROS, Neg liver ROS,   Endo/Other  negative endocrine ROS  Renal/GU negative Renal ROS  negative genitourinary   Musculoskeletal   Abdominal   Peds  Hematology negative hematology ROS (+)   Anesthesia Other Findings Obese  Past Medical History: No date: Asthma No date: Depression  Past Surgical History: No date: APPENDECTOMY  BMI    Body Mass Index:  36.04 kg/m      Reproductive/Obstetrics (+) Pregnancy                             Anesthesia Physical Anesthesia Plan  ASA: III  Anesthesia Plan: Epidural   Post-op Pain Management:    Induction:   PONV Risk Score and Plan:   Airway Management Planned:   Additional Equipment:   Intra-op Plan:   Post-operative Plan:   Informed Consent: I have reviewed the patients History and Physical, chart, labs and discussed the procedure including the risks, benefits and alternatives for the proposed anesthesia with the patient or authorized representative who has indicated his/her understanding and acceptance.       Plan Discussed with: Anesthesiologist  Anesthesia Plan Comments:         Anesthesia Quick Evaluation
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
## 2018-11-15 NOTE — Anesthesia Procedure Notes (Signed)
Epidural Patient location during procedure: OB Start time: 11/15/2018 10:07 PM End time: 11/15/2018 10:41 PM  Staffing Anesthesiologist: Jovita Gamma, MD Performed: anesthesiologist   Preanesthetic Checklist Completed: patient identified, site marked, surgical consent, pre-op evaluation, timeout performed, IV checked, risks and benefits discussed and monitors and equipment checked  Epidural Patient position: sitting Prep: ChloraPrep Patient monitoring: heart rate, continuous pulse ox and blood pressure Approach: midline Location: L2-L3 (unable to enter epidural space at lower levels) Injection technique: LOR saline  Needle:  Needle type: Tuohy  Needle gauge: 18 G Needle length: 9 cm and 9 Needle insertion depth: 7 cm Catheter type: closed end flexible Catheter size: 20 Guage Catheter at skin depth: 11 cm Test dose: negative and Other  Assessment Events: blood not aspirated, injection not painful, no injection resistance, negative IV test and no paresthesia  Additional Notes   Patient tolerated the insertion well without complications.Reason for block:procedure for pain

## 2018-11-16 ENCOUNTER — Telehealth: Payer: Self-pay

## 2018-11-16 DIAGNOSIS — Z3A39 39 weeks gestation of pregnancy: Secondary | ICD-10-CM

## 2018-11-16 DIAGNOSIS — O4202 Full-term premature rupture of membranes, onset of labor within 24 hours of rupture: Secondary | ICD-10-CM

## 2018-11-16 DIAGNOSIS — O4100X0 Oligohydramnios, unspecified trimester, not applicable or unspecified: Secondary | ICD-10-CM

## 2018-11-16 MED ORDER — METHYLERGONOVINE MALEATE 0.2 MG PO TABS: 0.2000 mg | ORAL_TABLET | ORAL | Status: DC | PRN

## 2018-11-16 MED ORDER — IBUPROFEN 600 MG PO TABS
600.0000 mg | ORAL_TABLET | Freq: Four times a day (QID) | ORAL | Status: DC
  Administered 2018-11-16 – 2018-11-17 (×5): 600 mg via ORAL
  Filled 2018-11-16 (×5): qty 1

## 2018-11-16 MED ORDER — ACETAMINOPHEN 325 MG PO TABS
650.0000 mg | ORAL_TABLET | ORAL | Status: DC | PRN
  Administered 2018-11-16 – 2018-11-17 (×5): 650 mg via ORAL
  Filled 2018-11-16 (×6): qty 2

## 2018-11-16 MED ORDER — SENNOSIDES-DOCUSATE SODIUM 8.6-50 MG PO TABS
2.0000 | ORAL_TABLET | ORAL | Status: DC
  Administered 2018-11-16: 2 via ORAL
  Filled 2018-11-16: qty 2

## 2018-11-16 MED ORDER — METHYLERGONOVINE MALEATE 0.2 MG/ML IJ SOLN: 0.2000 mg | INTRAMUSCULAR | Status: DC | PRN

## 2018-11-16 MED ORDER — SODIUM CHLORIDE 0.9% FLUSH: 3.0000 mL | INTRAVENOUS | Status: DC | PRN

## 2018-11-16 MED ORDER — SIMETHICONE 80 MG PO CHEW: 80.0000 mg | CHEWABLE_TABLET | ORAL | Status: DC | PRN

## 2018-11-16 MED ORDER — ONDANSETRON HCL 4 MG/2ML IJ SOLN: 4.0000 mg | INTRAMUSCULAR | Status: DC | PRN

## 2018-11-16 MED ORDER — DIPHENHYDRAMINE HCL 25 MG PO CAPS: 25.0000 mg | ORAL_CAPSULE | Freq: Four times a day (QID) | ORAL | Status: DC | PRN

## 2018-11-16 MED ORDER — WITCH HAZEL-GLYCERIN EX PADS: 1.0000 "application " | MEDICATED_PAD | CUTANEOUS | Status: DC | PRN

## 2018-11-16 MED ORDER — SODIUM CHLORIDE 0.9 % IV SOLN: 250.0000 mL | INTRAVENOUS | Status: DC | PRN

## 2018-11-16 MED ORDER — PRENATAL MULTIVITAMIN CH
1.0000 | ORAL_TABLET | Freq: Every day | ORAL | Status: DC
  Administered 2018-11-16 – 2018-11-17 (×2): 1 via ORAL
  Filled 2018-11-16 (×2): qty 1

## 2018-11-16 MED ORDER — DIBUCAINE (PERIANAL) 1 % EX OINT: 1.0000 "application " | TOPICAL_OINTMENT | CUTANEOUS | Status: DC | PRN

## 2018-11-16 MED ORDER — COCONUT OIL OIL
1.0000 "application " | TOPICAL_OIL | Status: DC | PRN
  Administered 2018-11-16: 1 via TOPICAL
  Filled 2018-11-16: qty 120

## 2018-11-16 MED ORDER — BENZOCAINE-MENTHOL 20-0.5 % EX AERO
1.0000 "application " | INHALATION_SPRAY | CUTANEOUS | Status: DC | PRN
  Administered 2018-11-16: 1 via TOPICAL
  Filled 2018-11-16: qty 56

## 2018-11-16 MED ORDER — ONDANSETRON HCL 4 MG PO TABS: 4.0000 mg | ORAL_TABLET | ORAL | Status: DC | PRN

## 2018-11-16 MED ORDER — SODIUM CHLORIDE 0.9% FLUSH: 3.0000 mL | Freq: Two times a day (BID) | INTRAVENOUS | Status: DC

## 2018-11-16 NOTE — Discharge Instructions (Signed)
Breastfeeding ° °Choosing to breastfeed is one of the best decisions you can make for yourself and your baby. A change in hormones during pregnancy causes your breasts to make breast milk in your milk-producing glands. Hormones prevent breast milk from being released before your baby is born. They also prompt milk flow after birth. Once breastfeeding has begun, thoughts of your baby, as well as his or her sucking or crying, can stimulate the release of milk from your milk-producing glands. °Benefits of breastfeeding °Research shows that breastfeeding offers many health benefits for infants and mothers. It also offers a cost-free and convenient way to feed your baby. °For your baby °· Your first milk (colostrum) helps your baby's digestive system to function better. °· Special cells in your milk (antibodies) help your baby to fight off infections. °· Breastfed babies are less likely to develop asthma, allergies, obesity, or type 2 diabetes. They are also at lower risk for sudden infant death syndrome (SIDS). °· Nutrients in breast milk are better able to meet your baby’s needs compared to infant formula. °· Breast milk improves your baby's brain development. °For you °· Breastfeeding helps to create a very special bond between you and your baby. °· Breastfeeding is convenient. Breast milk costs nothing and is always available at the correct temperature. °· Breastfeeding helps to burn calories. It helps you to lose the weight that you gained during pregnancy. °· Breastfeeding makes your uterus return faster to its size before pregnancy. It also slows bleeding (lochia) after you give birth. °· Breastfeeding helps to lower your risk of developing type 2 diabetes, osteoporosis, rheumatoid arthritis, cardiovascular disease, and breast, ovarian, uterine, and endometrial cancer later in life. °Breastfeeding basics °Starting breastfeeding °· Find a comfortable place to sit or lie down, with your neck and back  well-supported. °· Place a pillow or a rolled-up blanket under your baby to bring him or her to the level of your breast (if you are seated). Nursing pillows are specially designed to help support your arms and your baby while you breastfeed. °· Make sure that your baby's tummy (abdomen) is facing your abdomen. °· Gently massage your breast. With your fingertips, massage from the outer edges of your breast inward toward the nipple. This encourages milk flow. If your milk flows slowly, you may need to continue this action during the feeding. °· Support your breast with 4 fingers underneath and your thumb above your nipple (make the letter "C" with your hand). Make sure your fingers are well away from your nipple and your baby’s mouth. °· Stroke your baby's lips gently with your finger or nipple. °· When your baby's mouth is open wide enough, quickly bring your baby to your breast, placing your entire nipple and as much of the areola as possible into your baby's mouth. The areola is the colored area around your nipple. °? More areola should be visible above your baby's upper lip than below the lower lip. °? Your baby's lips should be opened and extended outward (flanged) to ensure an adequate, comfortable latch. °? Your baby's tongue should be between his or her lower gum and your breast. °· Make sure that your baby's mouth is correctly positioned around your nipple (latched). Your baby's lips should create a seal on your breast and be turned out (everted). °· It is common for your baby to suck about 2-3 minutes in order to start the flow of breast milk. °Latching °Teaching your baby how to latch onto your breast properly is   very important. An improper latch can cause nipple pain, decreased milk supply, and poor weight gain in your baby. Also, if your baby is not latched onto your nipple properly, he or she may swallow some air during feeding. This can make your baby fussy. Burping your baby when you switch breasts  during the feeding can help to get rid of the air. However, teaching your baby to latch on properly is still the best way to prevent fussiness from swallowing air while breastfeeding. °Signs that your baby has successfully latched onto your nipple °· Silent tugging or silent sucking, without causing you pain. Infant's lips should be extended outward (flanged). °· Swallowing heard between every 3-4 sucks once your milk has started to flow (after your let-down milk reflex occurs). °· Muscle movement above and in front of his or her ears while sucking. °Signs that your baby has not successfully latched onto your nipple °· Sucking sounds or smacking sounds from your baby while breastfeeding. °· Nipple pain. °If you think your baby has not latched on correctly, slip your finger into the corner of your baby’s mouth to break the suction and place it between your baby's gums. Attempt to start breastfeeding again. °Signs of successful breastfeeding °Signs from your baby °· Your baby will gradually decrease the number of sucks or will completely stop sucking. °· Your baby will fall asleep. °· Your baby's body will relax. °· Your baby will retain a small amount of milk in his or her mouth. °· Your baby will let go of your breast by himself or herself. °Signs from you °· Breasts that have increased in firmness, weight, and size 1-3 hours after feeding. °· Breasts that are softer immediately after breastfeeding. °· Increased milk volume, as well as a change in milk consistency and color by the fifth day of breastfeeding. °· Nipples that are not sore, cracked, or bleeding. °Signs that your baby is getting enough milk °· Wetting at least 1-2 diapers during the first 24 hours after birth. °· Wetting at least 5-6 diapers every 24 hours for the first week after birth. The urine should be clear or pale yellow by the age of 5 days. °· Wetting 6-8 diapers every 24 hours as your baby continues to grow and develop. °· At least 3 stools in  a 24-hour period by the age of 5 days. The stool should be soft and yellow. °· At least 3 stools in a 24-hour period by the age of 7 days. The stool should be seedy and yellow. °· No loss of weight greater than 10% of birth weight during the first 3 days of life. °· Average weight gain of 4-7 oz (113-198 g) per week after the age of 4 days. °· Consistent daily weight gain by the age of 5 days, without weight loss after the age of 2 weeks. °After a feeding, your baby may spit up a small amount of milk. This is normal. °Breastfeeding frequency and duration °Frequent feeding will help you make more milk and can prevent sore nipples and extremely full breasts (breast engorgement). Breastfeed when you feel the need to reduce the fullness of your breasts or when your baby shows signs of hunger. This is called "breastfeeding on demand." Signs that your baby is hungry include: °· Increased alertness, activity, or restlessness. °· Movement of the head from side to side. °· Opening of the mouth when the corner of the mouth or cheek is stroked (rooting). °· Increased sucking sounds, smacking lips, cooing,   sighing, or squeaking.  Hand-to-mouth movements and sucking on fingers or hands.  Fussing or crying. Avoid introducing a pacifier to your baby in the first 4-6 weeks after your baby is born. After this time, you may choose to use a pacifier. Research has shown that pacifier use during the first year of a baby's life decreases the risk of sudden infant death syndrome (SIDS). Allow your baby to feed on each breast as long as he or she wants. When your baby unlatches or falls asleep while feeding from the first breast, offer the second breast. Because newborns are often sleepy in the first few weeks of life, you may need to awaken your baby to get him or her to feed. Breastfeeding times will vary from baby to baby. However, the following rules can serve as a guide to help you make sure that your baby is properly  fed:  Newborns (babies 4 weeks of age or younger) may breastfeed every 1-3 hours.  Newborns should not go without breastfeeding for longer than 3 hours during the day or 5 hours during the night.  You should breastfeed your baby a minimum of 8 times in a 24-hour period. Breast milk pumping     Pumping and storing breast milk allows you to make sure that your baby is exclusively fed your breast milk, even at times when you are unable to breastfeed. This is especially important if you go back to work while you are still breastfeeding, or if you are not able to be present during feedings. Your lactation consultant can help you find a method of pumping that works best for you and give you guidelines about how long it is safe to store breast milk. Caring for your breasts while you breastfeed Nipples can become dry, cracked, and sore while breastfeeding. The following recommendations can help keep your breasts moisturized and healthy:  Avoid using soap on your nipples.  Wear a supportive bra designed especially for nursing. Avoid wearing underwire-style bras or extremely tight bras (sports bras).  Air-dry your nipples for 3-4 minutes after each feeding.  Use only cotton bra pads to absorb leaked breast milk. Leaking of breast milk between feedings is normal.  Use lanolin on your nipples after breastfeeding. Lanolin helps to maintain your skin's normal moisture barrier. Pure lanolin is not harmful (not toxic) to your baby. You may also hand express a few drops of breast milk and gently massage that milk into your nipples and allow the milk to air-dry. In the first few weeks after giving birth, some women experience breast engorgement. Engorgement can make your breasts feel heavy, warm, and tender to the touch. Engorgement peaks within 3-5 days after you give birth. The following recommendations can help to ease engorgement:  Completely empty your breasts while breastfeeding or pumping. You may  want to start by applying warm, moist heat (in the shower or with warm, water-soaked hand towels) just before feeding or pumping. This increases circulation and helps the milk flow. If your baby does not completely empty your breasts while breastfeeding, pump any extra milk after he or she is finished.  Apply ice packs to your breasts immediately after breastfeeding or pumping, unless this is too uncomfortable for you. To do this: ? Put ice in a plastic bag. ? Place a towel between your skin and the bag. ? Leave the ice on for 20 minutes, 2-3 times a day.  Make sure that your baby is latched on and positioned properly while breastfeeding. If   engorgement persists after 48 hours of following these recommendations, contact your health care provider or a Advertising copywriter. Overall health care recommendations while breastfeeding  Eat 3 healthy meals and 3 snacks every day. Well-nourished mothers who are breastfeeding need an additional 450-500 calories a day. You can meet this requirement by increasing the amount of a balanced diet that you eat.  Drink enough water to keep your urine pale yellow or clear.  Rest often, relax, and continue to take your prenatal vitamins to prevent fatigue, stress, and low vitamin and mineral levels in your body (nutrient deficiencies).  Do not use any products that contain nicotine or tobacco, such as cigarettes and e-cigarettes. Your baby may be harmed by chemicals from cigarettes that pass into breast milk and exposure to secondhand smoke. If you need help quitting, ask your health care provider.  Avoid alcohol.  Do not use illegal drugs or marijuana.  Talk with your health care provider before taking any medicines. These include over-the-counter and prescription medicines as well as vitamins and herbal supplements. Some medicines that may be harmful to your baby can pass through breast milk.  It is possible to become pregnant while breastfeeding. If birth  control is desired, ask your health care provider about options that will be safe while breastfeeding your baby. Where to find more information: [employer]: www.llli.org Contact a health care provider if:  You feel like you want to stop breastfeeding or have become frustrated with breastfeeding.  Your nipples are cracked or bleeding.  Your breasts are red, tender, or warm.  You have: ? Painful breasts or nipples. ? A swollen area on either breast. ? A fever or chills. ? Nausea or vomiting. ? Drainage other than breast milk from your nipples.  Your breasts do not become full before feedings by the fifth day after you give birth.  You feel sad and depressed.  Your baby is: ? Too sleepy to eat well. ? Having trouble sleeping. ? More than 66 week old and wetting fewer than 6 diapers in a 24-hour period. ? Not gaining weight by 32 days of age.  Your baby has fewer than 3 stools in a 24-hour period.  Your baby's skin or the white parts of his or her eyes become yellow. Get help right away if:  Your baby is overly tired (lethargic) and does not want to wake up and feed.  Your baby develops an unexplained fever. Summary  Breastfeeding offers many health benefits for infant and mothers.  Try to breastfeed your infant when he or she shows early signs of hunger.  Gently tickle or stroke your baby's lips with your finger or nipple to allow the baby to open his or her mouth. Bring the baby to your breast. Make sure that much of the areola is in your baby's mouth. Offer one side and burp the baby before you offer the other side.  Talk with your health care provider or lactation consultant if you have questions or you face problems as you breastfeed. This information is not intended to replace advice given to you by your health care provider. Make sure you discuss any questions you have with your health care provider. Document Released: 07/15/2005 Document Revised:  08/16/2016 Document Reviewed: 08/16/2016 Elsevier Interactive Patient Education  2019 [employer]. Home Care Instructions for Mom Activity  Gradually return to your regular activities.  Let yourself rest. Nap while your baby sleeps.  Avoid lifting anything that is heavier than 10 lb (  4.5 kg) until your health care provider says it is okay.  Avoid activities that take a lot of effort and energy (are strenuous) until approved by your health care provider. Walking at a slow-to-moderate pace is usually safe.  If you had a cesarean delivery: ? Do not vacuum, climb stairs, or drive a car for 4-6 weeks. ? Have someone help you at home until you feel like you can do your usual activities yourself. ? Do exercises as told by your health care provider, if this applies. Vaginal bleeding You may continue to bleed for 4-6 weeks after delivery. Over time, the amount of blood usually decreases and the color of the blood usually gets lighter. However, the flow of bright red blood may increase if you have been too active. If you need to use more than one pad in an hour because your pad gets soaked, or if you pass a large clot:  Lie down.  Raise your feet.  Place a cold compress on your lower abdomen.  Rest.  Call your health care provider. If you are breastfeeding, your period should return anytime between 8 weeks after delivery and the time that you stop breastfeeding. If you are not breastfeeding, your period should return 6-8 weeks after delivery. Perineal care The perineal area, or perineum, is the part of your body between your thighs. After delivery, this area needs special care. Follow these instructions as told by your health care provider.  Take warm tub baths for 15-20 minutes.  Use medicated pads and pain-relieving sprays and creams as told.  Do not use tampons or douches until vaginal bleeding has stopped.  Each time you go to the bathroom: ? Use a peri bottle. ? Change your  pad. ? Use towelettes in place of toilet paper until your stitches have healed.  Do Kegel exercises every day. Kegel exercises help to maintain the muscles that support the vagina, bladder, and bowels. You can do these exercises while you are standing, sitting, or lying down. To do Kegel exercises: ? Tighten the muscles of your abdomen and the muscles that surround your birth canal. ? Hold for a few seconds. ? Relax. ? Repeat until you have done this 5 times in a row.  To prevent hemorrhoids from developing or getting worse: ? Drink enough fluid to keep your urine clear or pale yellow. ? Avoid straining when having a bowel movement. ? Take over-the-counter medicines and stool softeners as told by your health care provider. Breast care  Wear a tight-fitting bra.  Avoid taking over-the-counter pain medicine for breast discomfort.  Apply ice to the breasts to help with discomfort as needed: ? Put ice in a plastic bag. ? Place a towel between your skin and the bag. ? Leave the ice on for 20 minutes or as told by your health care provider. Nutrition  Eat a well-balanced diet.  Do not try to lose weight quickly by cutting back on calories.  Take your prenatal vitamins until your postpartum checkup or until your health care provider tells you to stop. Postpartum depression You may find yourself crying for no apparent reason and unable to cope with all of the changes that come with having a newborn. This mood is called postpartum depression. Postpartum depression happens because your hormone levels change after delivery. If you have postpartum depression, get support from your partner, friends, and family. If the depression does not go away on its own after several weeks, contact your health care provider. Breast self-exam  Do a breast self-exam each month, at the same time of the month. If you are breastfeeding, check your breasts just after a feeding, when your breasts are less full. If  you are breastfeeding and your period has started, check your breasts on day 5, 6, or 7 of your period. Report any lumps, bumps, or discharge to your health care provider. Know that breasts are normally lumpy if you are breastfeeding. This is temporary, and it is not a health risk. Intimacy and sexuality Avoid sexual activity for at least 3-4 weeks after delivery or until the brownish-red vaginal flow is completely gone. If you want to avoid pregnancy, use some form of birth control. You can get pregnant after delivery, even if you have not had your period. Contact a health care provider if:  You feel unable to cope with the changes that a child brings to your life, and these feelings do not go away after several weeks.  You notice a lump, a bump, or discharge on your breast. Get help right away if:  Blood soaks your pad in 1 hour or less.  You have: ? Severe pain or cramping in your lower abdomen. ? A bad-smelling vaginal discharge. ? A fever that is not controlled by medicine. ? A fever, and an area of your breast is red and sore. ? Pain or redness in your calf. ? Sudden, severe chest pain. ? Shortness of breath. ? Painful or bloody urination. ? Problems with your vision. ? You vomit for 12 hours or longer. ? You develop a severe headache. ? You have serious thoughts about hurting yourself, your child, or anyone else. This information is not intended to replace advice given to you by your health care provider. Make sure you discuss any questions you have with your health care provider. Document Released: 07/12/2000 Document Revised: 09/10/2017 Document Reviewed: 01/16/2015 Elsevier Interactive Patient Education  2019 [employer] Postpartum Care After Vaginal Delivery This sheet gives you information about how to care for yourself from the time you deliver your baby to up to 6-12 weeks after delivery (postpartum period). Your health care provider may also give you more specific  instructions. If you have problems or questions, contact your health care provider. Follow these instructions at home: Vaginal bleeding  It is normal to have vaginal bleeding (lochia) after delivery. Wear a sanitary pad for vaginal bleeding and discharge. ? During the first week after delivery, the amount and appearance of lochia is often similar to a menstrual period. ? Over the next few weeks, it will gradually decrease to a dry, yellow-brown discharge. ? For most women, lochia stops completely by 4-6 weeks after delivery. Vaginal bleeding can vary from woman to woman.  Change your sanitary pads frequently. Watch for any changes in your flow, such as: ? A sudden increase in volume. ? A change in color. ? Large blood clots.  If you pass a blood clot from your vagina, save it and call your health care provider to discuss. Do not flush blood clots down the toilet before talking with your health care provider.  Do not use tampons or douches until your health care provider says this is safe.  If you are not breastfeeding, your period should return 6-8 weeks after delivery. If you are feeding your child breast milk only (exclusive breastfeeding), your period may not return until you stop breastfeeding. Perineal care  Keep the area between the vagina and the anus (perineum) clean and dry as told by your  health care provider. Use medicated pads and pain-relieving sprays and creams as directed.  If you had a cut in the perineum (episiotomy) or a tear in the vagina, check the area for signs of infection until you are healed. Check for: ? More redness, swelling, or pain. ? Fluid or blood coming from the cut or tear. ? Warmth. ? Pus or a bad smell.  You may be given a squirt bottle to use instead of wiping to clean the perineum area after you go to the bathroom. As you start healing, you may use the squirt bottle before wiping yourself. Make sure to wipe gently.  To relieve pain caused by an  episiotomy, a tear in the vagina, or swollen veins in the anus (hemorrhoids), try taking a warm sitz bath 2-3 times a day. A sitz bath is a warm water bath that is taken while you are sitting down. The water should only come up to your hips and should cover your buttocks. Breast care  Within the first few days after delivery, your breasts may feel heavy, full, and uncomfortable (breast engorgement). Milk may also leak from your breasts. Your health care provider can suggest ways to help relieve the discomfort. Breast engorgement should go away within a few days.  If you are breastfeeding: ? Wear a bra that supports your breasts and fits you well. ? Keep your nipples clean and dry. Apply creams and ointments as told by your health care provider. ? You may need to use breast pads to absorb milk that leaks from your breasts. ? You may have uterine contractions every time you breastfeed for up to several weeks after delivery. Uterine contractions help your uterus return to its normal size. ? If you have any problems with breastfeeding, work with your health care provider or Advertising copywriter.  If you are not breastfeeding: ? Avoid touching your breasts a lot. Doing this can make your breasts produce more milk. ? Wear a good-fitting bra and use cold packs to help with swelling. ? Do not squeeze out (express) milk. This causes you to make more milk. Intimacy and sexuality  Ask your health care provider when you can engage in sexual activity. This may depend on: ? Your risk of infection. ? How fast you are healing. ? Your comfort and desire to engage in sexual activity.  You are able to get pregnant after delivery, even if you have not had your period. If desired, talk with your health care provider about methods of birth control (contraception). Medicines  Take over-the-counter and prescription medicines only as told by your health care provider.  If you were prescribed an antibiotic  medicine, take it as told by your health care provider. Do not stop taking the antibiotic even if you start to feel better. Activity  Gradually return to your normal activities as told by your health care provider. Ask your health care provider what activities are safe for you.  Rest as much as possible. Try to rest or take a nap while your baby is sleeping. Eating and drinking   Drink enough fluid to keep your urine pale yellow.  Eat high-fiber foods every day. These may help prevent or relieve constipation. High-fiber foods include: ? Whole grain cereals and breads. ? Brown rice. ? Beans. ? Fresh fruits and vegetables.  Do not try to lose weight quickly by cutting back on calories.  Take your prenatal vitamins until your postpartum checkup or until your health care provider tells you it  is okay to stop. Lifestyle  Do not use any products that contain nicotine or tobacco, such as cigarettes and e-cigarettes. If you need help quitting, ask your health care provider.  Do not drink alcohol, especially if you are breastfeeding. General instructions  Keep all follow-up visits for you and your baby as told by your health care provider. Most women visit their health care provider for a postpartum checkup within the first 3-6 weeks after delivery. Contact a health care provider if:  You feel unable to cope with the changes that your child brings to your life, and these feelings do not go away.  You feel unusually sad or worried.  Your breasts become red, painful, or hard.  You have a fever.  You have trouble holding urine or keeping urine from leaking.  You have little or no interest in activities you used to enjoy.  You have not breastfed at all and you have not had a menstrual period for 12 weeks after delivery.  You have stopped breastfeeding and you have not had a menstrual period for 12 weeks after you stopped breastfeeding.  You have questions about caring for yourself or  your baby.  You pass a blood clot from your vagina. Get help right away if:  You have chest pain.  You have difficulty breathing.  You have sudden, severe leg pain.  You have severe pain or cramping in your lower abdomen.  You bleed from your vagina so much that you fill more than one sanitary pad in one hour. Bleeding should not be heavier than your heaviest period.  You develop a severe headache.  You faint.  You have blurred vision or spots in your vision.  You have bad-smelling vaginal discharge.  You have thoughts about hurting yourself or your baby. If you ever feel like you may hurt yourself or others, or have thoughts about taking your own life, get help right away. You can go to the nearest emergency department or call:  Your local emergency services (911 in the [place]  A suicide crisis helpline, such as the National Suicide Prevention Lifeline at 503-699-5054. This is open 24 hours a day. Summary  The period of time right after you deliver your newborn up to 6-12 weeks after delivery is called the postpartum period.  Gradually return to your normal activities as told by your health care provider.  Keep all follow-up visits for you and your baby as told by your health care provider. This information is not intended to replace advice given to you by your health care provider. Make sure you discuss any questions you have with your health care provider. Document Released: 05/12/2007 Document Revised: 04/28/2017 Document Reviewed: 04/28/2017 Elsevier Interactive Patient Education  2019 [employer]. Postpartum Baby Blues The postpartum period begins right after the birth of a baby. During this time, there is often a lot of joy and excitement. It is also a time of many changes in the life of the parents. No matter how many times a mother gives birth, each child brings new challenges to the family, including different ways of relating to one another. It is common to  have feelings of excitement along with confusing changes in moods, emotions, and thoughts. You may feel happy one minute and sad or stressed the next. These feelings of sadness usually happen in the period right after you have your baby, and they go away within a week or two. This is called the "baby blues." What are the causes?  There is no known cause of baby blues. It is likely caused by a combination of factors. However, changes in hormone levels after childbirth are believed to trigger some of the symptoms. Other factors that can play a role in these mood changes include:  Lack of sleep.  Stressful life events, such as poverty, caring for a loved one, or death of a loved one.  Genetics. What are the signs or symptoms? Symptoms of this condition include:  Brief changes in mood, such as going from extreme happiness to sadness.  Decreased concentration.  Difficulty sleeping.  Crying spells and tearfulness.  Loss of appetite.  Irritability.  Anxiety. If the symptoms of baby blues last for more than 2 weeks or become more severe, you may have postpartum depression. How is this diagnosed? This condition is diagnosed based on an evaluation of your symptoms. There are no medical or lab tests that lead to a diagnosis, but there are various questionnaires that a health care provider may use to identify women with the baby blues or postpartum depression. How is this treated? Treatment is not needed for this condition. The baby blues usually go away on their own in 1-2 weeks. Social support is often all that is needed. You will be encouraged to get adequate sleep and rest. Follow these instructions at home: Lifestyle      Get as much rest as you can. Take a nap when the baby sleeps.  Exercise regularly as told by your health care provider. Some women find yoga and walking to be helpful.  Eat a balanced and nourishing diet. This includes plenty of fruits and vegetables, whole grains,  and lean proteins.  Do little things that you enjoy. Have a cup of tea, take a bubble bath, read your favorite magazine, or listen to your favorite music.  Avoid alcohol.  Ask for help with household chores, cooking, grocery shopping, or running errands. Do not try to do everything yourself. Consider hiring a postpartum doula to help. This is a professional who specializes in providing support to new mothers.  Try not to make any major life changes during pregnancy or right after giving birth. This can add stress. General instructions  Talk to people close to you about how you are feeling. Get support from your partner, family members, friends, or other new moms. You may want to join a support group.  Find ways to cope with stress. This may include: ? Writing your thoughts and feelings in a journal. ? Spending time outside. ? Spending time with people who make you laugh.  Try to stay positive in how you think. Think about the things you are grateful for.  Take over-the-counter and prescription medicines only as told by your health care provider.  Let your health care provider know if you have any concerns.  Keep all postpartum visits as told by your health care provider. This is important. Contact a health care provider if:  Your baby blues do not go away after 2 weeks. Get help right away if:  You have thoughts of taking your own life (suicidal thoughts).  You think you may harm the baby or other people.  You see or hear things that are not there (hallucinations). Summary  After giving birth, you may feel happy one minute and sad or stressed the next. Feelings of sadness that happen right after the baby is born and go away after a week or two are called the "baby blues."  You can manage the baby  blues by getting enough rest, eating a healthy diet, exercising, spending time with supportive people, and finding ways to cope with stress.  If feelings of sadness and stress last  longer than 2 weeks or get in the way of caring for your baby, talk to your health care provider. This may mean you have postpartum depression. This information is not intended to replace advice given to you by your health care provider. Make sure you discuss any questions you have with your health care provider. Document Released: 04/18/2004 Document Revised: 09/10/2016 Document Reviewed: 09/10/2016 Elsevier Interactive Patient Education  2019 [employer]

## 2018-11-16 NOTE — Clinical Social Work Maternal (Signed)
  CLINICAL SOCIAL WORK MATERNAL/CHILD NOTE  Patient Details  Name: Felicia Scott MRN: [id number] Date of Birth: 1995/09/12  Date:  11/16/2018  Clinical Social Worker Initiating Note:  Shela Leff MSW,LCSW Date/Time: Initiated:  11/16/18/      Child's Name:      Biological Parents:  Mother, Father   Need for Interpreter:  None   Reason for Referral:  (history of marijuana use in beginning of pregnancy)   Address:  [place] [address]    Phone number:  [phone] (home)     Additional phone number: none  Household Members/Support Persons (HM/SP):       HM/SP Name Relationship DOB or Age  HM/SP -1        HM/SP -2        HM/SP -3        HM/SP -4        HM/SP -5        HM/SP -6        HM/SP -7        HM/SP -8          Natural Supports (not living in the home):  Spouse/significant other   Professional Supports:     Employment:     Type of Work:     Education:      Homebound arranged:    Museum/gallery curator Resources:  Medicaid   Other Resources:      Cultural/Religious Considerations Which May Impact Care:  none  Strengths:  Ability to meet basic needs , Home prepared for child    Psychotropic Medications:         Pediatrician:       Pediatrician List:   [place]      Pediatrician Fax Number:    Risk Factors/Current Problems:  None   Cognitive State:  Alert , Able to Concentrate    Mood/Affect:  Calm , Happy , Tearful    CSW Assessment: CSW met with patient alone in her room this afternoon and patient's significant other stepped out of room. Patient was holding her newborn and bonding well. Patient states that she will be living with her parents who have been very supportive throughout her pregnancy. She states she has all necessities for her newborn and has no concerns regarding transportation. She admits to having a history of  depression in which she attempted suicide a few years ago. She stated she has been on medication for depression but took herself off while pregnant. Patient admits to having feelings of fear that something is going to happen to her baby. She denies having suicidal thoughts. She wishes to resume her anti depressant medication and CSW informed patient that the nurse would be informed. Patient admitted to using marijuana in the beginning of her pregnancy for nausea but then stopped after she tested positive at her first OB appointment. She was afraid that DSS CPS would be contacted. CSW explained that because she and baby both tested negative, that there would be no DSS CPS report. Patient has no intention of using again.   CSW Plan/Description:  No Further Intervention Required/No Barriers to Discharge    Shela Leff, LCSW 11/16/2018, 3:58 PM

## 2018-11-16 NOTE — Lactation Note (Signed)
This note was copied from a baby's chart. Lactation Consultation Note  Patient Name: Boy Tytiona Antonacci TXMIW'O Date: 11/16/2018 Reason for consult: Follow-up assessment;Primapara;Term;Other (Comment)  When walked in room, Kaleen Mask was beginning to stir even though he was still sleepy.  Demonstrated beginning feeding cues and waking techniques.  Mom has tubular, wide spaced breasts with not a lot of glandular tissue and bulbar nipples.  Left nipple is slightly flat.  After got him to latch at 11:21 am feeding and he sucked for 3 minutes on that side, it was more everted.  Even though mom needed a lot of help latching him to right breast at last feeding, once he got latched he breast fed well.  We decided to start on left breast for this feeding.  Mom has really sore perineum.  Assisted mom with getting in comfortable position with pillow support with Gadiel in football hold skin to skin.  Can easily hand express colostrum from both breasts now.  Earlier could only get colostrum from right breast.  Initially, he was getting shallow latch and we had to keep re latching.  During that time, was hand expressing and dripping in mouth.  Once we got a deep enough latch, he sucked for approximately 10 minutes with audible swallows on left breast even though he kept slipping back to tip of nipple on occasion.  Mom could tell when he was resorting to a shallow latch because it was painful.  After breast feed, demonstrated hand expression of colostrum to rub onto nipple.  Mom has been using coconut oil for discomfort.  Reviewed supply and demand, normal course of lactation and routine newborn feeding patterns.  Lactation name and number has been written on white board and encouraged to call with any questions, concerns or assistance.     Maternal Data Formula Feeding for Exclusion: No Has patient been taught Hand Expression?: Yes(Can easily hand express from both sides now) Does the patient have breastfeeding experience  prior to this delivery?: No  Feeding Feeding Type: Breast Fed  LATCH Score Latch: Repeated attempts needed to sustain latch, nipple held in mouth throughout feeding, stimulation needed to elicit sucking reflex.  Audible Swallowing: A few with stimulation  Type of Nipple: Everted at rest and after stimulation(Was flat, but can compress now & baby helped evert)  Comfort (Breast/Nipple): Soft / non-tender  Hold (Positioning): Assistance needed to correctly position infant at breast and maintain latch.  LATCH Score: 7  Interventions Interventions: Assisted with latch;Skin to skin;Breast massage;Hand express;Reverse pressure;Breast compression;Adjust position;Support pillows;Position options;Coconut oil  Lactation Tools Discussed/Used WIC Program: Yes   Consult Status Consult Status: PRN Follow-up type: Call as needed    Louis Meckel 11/16/2018, 3:26 PM

## 2018-11-16 NOTE — Telephone Encounter (Signed)
Pt was admitted to hospital. 
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
## 2018-11-17 ENCOUNTER — Encounter: Payer: Medicaid Other | Admitting: Certified Nurse Midwife

## 2018-11-17 ENCOUNTER — Other Ambulatory Visit: Payer: Medicaid Other

## 2018-11-17 LAB — CBC
HCT: 32 % — ABNORMAL LOW (ref 36.0–46.0)
Hemoglobin: 10 g/dL — ABNORMAL LOW (ref 12.0–15.0)
MCH: 26.8 pg (ref 26.0–34.0)
MCHC: 31.3 g/dL (ref 30.0–36.0)
MCV: 85.8 fL (ref 80.0–100.0)
Platelets: 225 10*3/uL (ref 150–400)
RBC: 3.73 MIL/uL — ABNORMAL LOW (ref 3.87–5.11)
RDW: 15.9 % — ABNORMAL HIGH (ref 11.5–15.5)
WBC: 9.1 10*3/uL (ref 4.0–10.5)
nRBC: 0 % (ref 0.0–0.2)

## 2018-11-17 LAB — RPR: RPR Ser Ql: NONREACTIVE

## 2018-11-17 MED ORDER — VITAMIN D3 125 MCG (5000 UT) PO CAPS: 1.0000 | ORAL_CAPSULE | Freq: Every day | ORAL | 2 refills | Status: DC

## 2018-11-17 MED ORDER — SERTRALINE HCL 25 MG PO TABS: 25.0000 mg | ORAL_TABLET | Freq: Every day | ORAL | 2 refills | Status: DC

## 2018-11-17 MED ORDER — FUSION PLUS PO CAPS: 1.0000 | ORAL_CAPSULE | Freq: Every day | ORAL | 1 refills | Status: DC

## 2018-11-17 MED ORDER — IBUPROFEN 600 MG PO TABS: 600.0000 mg | ORAL_TABLET | Freq: Four times a day (QID) | ORAL | 0 refills | Status: DC

## 2018-11-17 MED ORDER — SERTRALINE HCL 25 MG PO TABS
25.0000 mg | ORAL_TABLET | Freq: Every day | ORAL | Status: DC
  Administered 2018-11-17: 25 mg via ORAL
  Filled 2018-11-17: qty 1

## 2018-11-17 NOTE — Lactation Note (Signed)
This note was copied from a baby's chart. Lactation Consultation Note  Patient Name: Felicia Scott Date: 11/17/2018 Reason for consult: Mother's request   Maternal Data Has patient been taught Hand Expression?: Yes  Feeding Feeding Type: Breast Fed  LATCH Score Latch: Grasps breast easily, tongue down, lips flanged, rhythmical sucking.  Audible Swallowing: Spontaneous and intermittent  Type of Nipple: Everted at rest and after stimulation  Comfort (Breast/Nipple): Soft / non-tender  Hold (Positioning): Assistance needed to correctly position infant at breast and maintain latch.  LATCH Score: 9  Interventions Interventions: Breast feeding basics reviewed;Assisted with latch;Adjust position;Support pillows;Position options  Lactation Tools Discussed/Used     Consult Status Consult Status: Complete Date: 11/17/18 Follow-up type: In-patient Pt requested LC assistance to get a deeper latch. LC switched positions and breast for dyad because baby finished on the same side at prior feeding. Explained to family to importance of alternating breasts inbetween feedings. LC also showed family how to get deeper latch and how baby's lips should be placed on mom's areola during feedings. LC also heard swallows before leaving room.    Burnadette Peter 11/17/2018, 1:21 PM

## 2018-11-17 NOTE — Anesthesia Postprocedure Evaluation (Signed)
Anesthesia Post Note  Patient: Felicia Scott  Procedure(s) Performed: AN AD HOC LABOR EPIDURAL  Patient location during evaluation: Mother Baby Anesthesia Type: Epidural Level of consciousness: awake and alert Pain management: pain level controlled Vital Signs Assessment: post-procedure vital signs reviewed and stable Respiratory status: spontaneous breathing, nonlabored ventilation and respiratory function stable Cardiovascular status: stable Postop Assessment: no headache, no backache and epidural receding Anesthetic complications: no     Last Vitals:  Vitals:   11/16/18 1622 11/16/18 2320  BP: 115/76 115/68  Pulse: 76 83  Resp: 20 18  Temp: 36.9 C 37.1 C  SpO2: 97% 98%    Last Pain:  Vitals:   11/17/18 0442  TempSrc:   PainSc: 7                  Phillippa Straub

## 2018-11-17 NOTE — Discharge Summary (Signed)
Physician Obstetric Discharge Summary  Patient ID: Felicia Scott MRN: [id number] DOB/AGE: 21/21/1997 21 y.o.   Date of Admission: 11/15/2018  Date of Discharge: 11/17/2018  Admitting Diagnosis: Onset of Labor and Premature rupture of membrane at [redacted]w[redacted]d  Secondary Diagnosis: anxiety,   Mode of Delivery: normal spontaneous vaginal delivery     Discharge Diagnosis: SVD, PP anemia, anxiety with depression   Intrapartum Procedures: pitocin augmentation   Post partum procedures: none  Complications: 2nd degree perineal laceration   Brief Hospital Course  Felicia Scott is a G5P0040 who had a SVD on 11/16/2018;  for further details of this, please refer to the delivey note.  Patient had an uncomplicated postpartum course.  By time of discharge on PPD#1, her pain was controlled on oral pain medications; she had appropriate lochia and was ambulating, voiding without difficulty and tolerating regular diet.  She was deemed stable for discharge to home.     Labs: CBC Latest Ref Rng & Units 11/17/2018 11/15/2018 08/31/2018  WBC 4.0 - 10.5 K/uL 9.1 8.8 6.7  Hemoglobin 12.0 - 15.0 g/dL 10.0(L) 11.5(L) 11.2  Hematocrit 36.0 - 46.0 % 32.0(L) 36.8 34.8  Platelets 150 - 400 K/uL 225 235 229   O POS  Physical exam:  Blood pressure 123/77, pulse 79, temperature 98 F (36.7 C), temperature source Oral, resp. rate 20, height 5\' 3"  (1.6 m), weight 92.3 kg, last menstrual period 03/13/2018, SpO2 99 %. General: alert and no distress Lochia: appropriate Abdomen: soft, NT Uterine Fundus: firm Extremities: No evidence of DVT seen on physical exam. mild lower extremity edema.  Discharge Instructions: Per After Visit Summary. Activity: Advance as tolerated. Pelvic rest for 6 weeks.  Also refer to After Visit Summary Diet: Regular Medications: Allergies as of 11/17/2018   No Known Allergies     Medication List    TAKE these medications   Fusion Plus Caps Take 1 capsule by mouth daily.   ibuprofen  600 MG tablet Commonly known as:  ADVIL Take 1 tablet (600 mg total) by mouth every 6 (six) hours.   sertraline 25 MG tablet Commonly known as:  ZOLOFT Take 1 tablet (25 mg total) by mouth daily.   valACYclovir 1000 MG tablet Commonly known as:  VALTREX Take 1 tablet (1,000 mg total) by mouth daily.   Vitafol Gummies 3.33-0.333-34.8 MG Chew Chew 1 tablet by mouth 2 (two) times daily.   Vitamin D3 125 MCG (5000 UT) Caps Take 1 capsule (5,000 Units total) by mouth daily.      Outpatient follow up:  Follow-up Information    Gunnar Bulla, CNM. Call in 6 week(s).   Specialties:  Certified Nurse Midwife, Obstetrics and Gynecology, Radiology Why:  Please call to schedule six (6) week postpartum visit with [facility] Contact information: [address] [place] [address] [phone]          Postpartum contraception: IUD  Discharged Condition: good  Discharged to: home   Newborn Data: Disposition:home with mother, no circumcision planned  Apgars: APGAR (1 MIN): 8   APGAR (5 MINS): 9   APGAR (10 MINS):    Baby Feeding: Breast   Suzan Nailer, CNM

## 2018-11-17 NOTE — Lactation Note (Signed)
This note was copied from a baby's chart. Lactation Consultation Note  Patient Name: Felicia Scott PXTGG'Y Date: 11/17/2018 Reason for consult: Follow-up assessment   Maternal Data Has patient been taught Hand Expression?: Yes  Feeding Feeding Type: Breast Fed  LATCH Score Latch: Grasps breast easily, tongue down, lips flanged, rhythmical sucking.  Audible Swallowing: A few with stimulation  Type of Nipple: Everted at rest and after stimulation  Comfort (Breast/Nipple): Soft / non-tender  Hold (Positioning): Assistance needed to correctly position infant at breast and maintain latch.  LATCH Score: 8  Interventions Interventions: Breast feeding basics reviewed;Assisted with latch;Hand express  Lactation Tools Discussed/Used     Consult Status Consult Status: Complete Date: 11/17/18 Follow-up type: In-patient LC assisted [name] reposition baby for a deeper latch. LC walked in the room and the [name] stated that the baby had just started the nursing session. Baby appeared to be on mom's nipple so LC showed parents how to [name] baby. LC then showed them how to hand express and [name] and [name] both demonstrated.   LC then walked [name] through how to properly get a deep latch since her breasts are pendulum and wide spaced, but milk production seems to be working fine due to baby's wt loss, output, and amount of colostrum LC was able to hand express.  Parents had questions about how long to feed baby, should they burp baby, how often to change diaper, and feeding cues. LC reviewed all topics and spoke with family about how to receive breastfeeding support after d/c from hospital.    Burnadette Peter 11/17/2018, 12:27 PM

## 2018-11-17 NOTE — Progress Notes (Signed)
DC inst reviewed with pt.  Verb u/o of f/u and home care.  Inst that she can call back to [facility] office here at hospital if home and need further assistance.  DC to home with NB to car via staff.

## 2018-12-25 ENCOUNTER — Telehealth: Payer: Self-pay

## 2018-12-25 NOTE — Telephone Encounter (Signed)
Coronavirus (COVID-19) Are you at risk?  Are you at risk for the Coronavirus (COVID-19)?  To be considered HIGH RISK for Coronavirus (COVID-19), you have to meet the following criteria:  . Traveled to [place], [place], [place], [place] or [place]; or in the [place] to [place], [place], [place], or [place]; and have fever, cough, and shortness of breath within the last 2 weeks of travel OR . Been in close contact with a person diagnosed with COVID-19 within the last 2 weeks and have fever, cough, and shortness of breath . IF YOU DO NOT MEET THESE CRITERIA, YOU ARE CONSIDERED LOW RISK FOR COVID-19.  What to do if you are HIGH RISK for COVID-19?  . If you are having a medical emergency, call 911. . Seek medical care right away. Before you go to a doctor's office, urgent care or emergency department, call ahead and tell them about your recent travel, contact with someone diagnosed with COVID-19, and your symptoms. You should receive instructions from your physician's office regarding next steps of care.  . When you arrive at healthcare provider, tell the healthcare staff immediately you have returned from visiting [place], [place], [place], [place] or [place]; or traveled in the [place] to [place], [place], [place], or [place]; in the last two weeks or you have been in close contact with a person diagnosed with COVID-19 in the last 2 weeks.   . Tell the health care staff about your symptoms: fever, cough and shortness of breath. . After you have been seen by a medical provider, you will be either: o Tested for (COVID-19) and discharged home on quarantine except to seek medical care if symptoms worsen, and asked to  - Stay home and avoid contact with others until you get your results (4-5 days)  - Avoid travel on public transportation if possible (such as bus, train, or airplane) or o Sent to the Emergency Department by EMS for evaluation, COVID-19 testing, and possible  admission depending on your condition and test results.  What to do if you are LOW RISK for COVID-19?  Reduce your risk of any infection by using the same precautions used for avoiding the common cold or flu:  . Wash your hands often with soap and warm water for at least 20 seconds.  If soap and water are not readily available, use an alcohol-based hand sanitizer with at least 60% alcohol.  . If coughing or sneezing, cover your mouth and nose by coughing or sneezing into the elbow areas of your shirt or coat, into a tissue or into your sleeve (not your hands). . Avoid shaking hands with others and consider head nods or verbal greetings only. . Avoid touching your eyes, nose, or mouth with unwashed hands.  . Avoid close contact with people who are Felicia Scott. . Avoid places or events with large numbers of people in one location, like concerts or sporting events. . Carefully consider travel plans you have or are making. . If you are planning any travel outside or inside the [place], visit the CDC's Travelers' Health webpage for the latest health notices. . If you have some symptoms but not all symptoms, continue to monitor at home and seek medical attention if your symptoms worsen. . If you are having a medical emergency, call 911.  12/25/18 SCREENING NEG SLS ADDITIONAL HEALTHCARE OPTIONS FOR PATIENTS  [facility] / e-Visit: [email or web address]         [facility]: [phone]    [facility]: [phone]                   [facility]: [phone]  

## 2018-12-28 ENCOUNTER — Encounter: Payer: Self-pay | Admitting: Certified Nurse Midwife

## 2018-12-28 ENCOUNTER — Other Ambulatory Visit: Payer: Self-pay

## 2018-12-28 ENCOUNTER — Ambulatory Visit (INDEPENDENT_AMBULATORY_CARE_PROVIDER_SITE_OTHER): Payer: Medicaid Other | Admitting: Certified Nurse Midwife

## 2018-12-28 MED ORDER — DROSPIRENONE 4 MG PO TABS: 1.0000 | ORAL_TABLET | Freq: Every day | ORAL | 3 refills | Status: DC

## 2018-12-28 NOTE — Progress Notes (Signed)
Subjective:    Felicia Scott is a 23 y.o. G29P1041 Hispanic female who presents for a postpartum visit. She is 6 weeks postpartum following a spontaneous vaginal delivery at 39+4 gestational weeks. Anesthesia: epidural. I have fully reviewed the prenatal and intrapartum course.   Postpartum course has been uncomplicated. Baby's course has been uncomplicated. Baby is feeding by breast. Bleeding no bleeding. Bowel function is normal. Bladder function is normal. Patient is not sexually active.    Contraception method is oral progesterone-only contraceptive. Postpartum depression screening: positive. Score 20.  Last pap N/A.   Denies difficulty breathing or respiratory distress, chest pain, abdominal pain, excessive vaginal bleeding, dysuria, and leg pain or swelling. No SI/HI.   The following portions of the patient's history were reviewed and updated as appropriate: allergies, current medications, past medical history, past surgical history and problem list.  Review of Systems  Pertinent items are noted in HPI.   Objective:   BP (!) 62/38   Pulse 75   Ht 5\' 3"  (1.6 m)   Wt 195 lb 9.6 oz (88.7 kg)   LMP 03/13/2018 (Exact Date)   Breastfeeding Yes   BMI 34.65 kg/m   General:  alert, cooperative and no distress   Breasts:  deferred, no complaints  Lungs: clear to auscultation bilaterally  Heart:  regular rate and rhythm  Abdomen: soft, nontender   Vulva: normal  Vagina: normal vagina  Cervix:  closed  Corpus: Well-involuted  Adnexa:  Non-palpable   Depression screen [facility] 2/9 12/28/2018 06/03/2018  Decreased Interest 2 1  Down, Depressed, Hopeless 3 2  PHQ - 2 Score 5 3  Altered sleeping 3 2  Tired, decreased energy 3 2  Change in appetite 3 2  Feeling bad or failure about yourself  0 1  Trouble concentrating 3 -  Moving slowly or fidgety/restless 3 0  Suicidal thoughts 0 0  PHQ-9 Score 20 10  Difficult doing work/chores Somewhat difficult Somewhat difficult   GAD 7 :  Generalized Anxiety Score 12/28/2018  Nervous, Anxious, on Edge 3  Control/stop worrying 3  Worry too much - different things 3  Trouble relaxing 3  Restless 3  Easily annoyed or irritable 3  Afraid - awful might happen 3  Total GAD 7 Score 21  Anxiety Difficulty Not difficult at all        Assessment:   Postpartum exam Six (6) wks s/p spontaneous vaginal birth Breastfeeding Depression screening Contraception counseling   Plan:   Advised to increase Zoloft by 25 mg PO weekly as needed.   Declines referral to counseling.   Prefers lab work next visit.   Rx: Slynd, see orders. Slynd sample and condoms given.   Encouraged routine health maintenance.   Follow up in: 6-8 weeks for medication check or earlier if needed.    Gunnar Bulla, CNM [facility], [facility] 12/28/18 11:44 AM

## 2018-12-28 NOTE — Patient Instructions (Signed)
Drospirenone tablets (contraception) What is this medicine? DROSPIRENONE (dro SPY re nown) is an oral contraceptive (birth control pill). The product contains a female hormone known as a progestin. It is used to prevent pregnancy. This medicine may be used for other purposes; ask your health care provider or pharmacist if you have questions. COMMON BRAND NAME(S): SLYND What should I tell my health care provider before I take this medicine? They need to know if you have any of these conditions: -abnormal vaginal bleeding -adrenal gland disease -blood vessel disease or blood clots -breast, cervical, endometrial, ovarian, liver, or uterine cancer -diabetes -heart disease or recent heart attack -high potassium level -kidney disease -liver disease -mental depression -migraine headaches -stroke -an unusual or allergic reaction to drospirenone, progestins, or other medicines, foods, dyes, or preservatives -pregnant or trying to get pregnant -breast-feeding How should I use this medicine? Take this medicine by mouth. To reduce nausea, this medicine may be taken with food. Follow the directions on the prescription label. Take this medicine at the same time each day and in the order directed on the package. Do not take your medicine more often than directed. A patient package insert for the product will be given with each prescription and refill. Read this sheet carefully each time. The sheet may change frequently. Talk to your pediatrician regarding the use of this medicine in children. Special care may be needed. This medicine has been used in female children who have started having menstrual periods. Overdosage: If you think you have taken too much of this medicine contact a poison control center or emergency room at once. NOTE: This medicine is only for you. Do not share this medicine with others. What if I miss a dose? If you miss a dose, take it as soon as you can and refer to the patient  information sheet you received with your medicine for direction. If you miss more than one pill, this medicine may not be as effective and you may need to use another form of birth control. What may interact with this medicine? Do not take this medicine with any of the following medications: -atazanavir; cobicistat -bosentan -fosamprenavir This medicine may also interact with the following medications: -aprepitant -barbiturates like phenobarbital, primidone -carbamazepine -certain antibiotics like clarithromycin, rifampin, rifabutin, rifapentine -certain antivirals for HIV or hepatitis -certain diuretics like amiloride, spironolactone, triamterene -certain medicines for fungal infections like griseofulvin, ketoconazole, itraconazole, voriconazole -certain medicines for blood pressure, heart disease -cyclosporine -felbamate -heparin -medicines for diabetes -modafinil -NSAIDs, medicines for pain and inflammation, like ibuprofen or naproxen -oxcarbazepine -phenytoin -potassium supplements -rufinamide -St. John's wort -topiramate This list may not describe all possible interactions. Give your health care provider a list of all the medicines, herbs, non-prescription drugs, or dietary supplements you use. Also tell them if you smoke, drink alcohol, or use illegal drugs. Some items may interact with your medicine. What should I watch for while using this medicine? Visit your doctor or health care professional for regular checks on your progress. You will need a regular breast and pelvic exam and Pap smear while on this medicine. You may need blood work done while you are taking this medicine. If you have any reason to think you are pregnant, stop taking this medicine right away and contact your doctor or health care professional. This medicine does not protect you against HIV infection (AIDS) or any other sexually transmitted diseases. If you are going to have elective surgery, you may need  to stop taking this medicine before  the surgery. Consult your health care professional for advice. What side effects may I notice from receiving this medicine? Side effects that you should report to your doctor or health care professional as soon as possible: -allergic reactions like skin rash, itching or hives, swelling of the face, lips, or tongue -breast tissue changes or discharge -depressed mood -severe pain, swelling, or tenderness in the abdomen -signs and symptoms of a blood clot such as chest pain; shortness of breath; pain, swelling, or warmth in the leg -signs and symptoms of increased potassium like muscle weakness; chest pain; or fast, irregular heartbeat -signs and symptoms of liver injury like dark yellow or brown urine; general ill feeling or flu-like symptoms; light-colored stools; loss of appetite; nausea; right upper belly pain; unusually weak or tired; yellowing of the eyes or skin -signs and symptoms of a stroke like changes in vision; confusion; trouble speaking or understanding; severe headaches; sudden numbness or weakness of the face, arm or leg; trouble walking; dizziness; loss of balance or coordination -unusual vaginal bleeding -unusually weak or tired Side effects that usually do not require medical attention (report these to your doctor or health care professional if they continue or are bothersome): -acne -breast tenderness -headache -menstrual cramps -nausea -weight gain This list may not describe all possible side effects. Call your doctor for medical advice about side effects. You may report side effects to FDA at 1-800-FDA-1088. Where should I keep my medicine? Keep out of the reach of children. Store at room temperature between 20 and 25 degrees C (68 and 77 degrees F). Throw away any unused medicine after the expiration date. NOTE: This sheet is a summary. It may not cover all possible information. If you have questions about this medicine, talk to your  doctor, pharmacist, or health care provider.  2019 Elsevier/Gold Standard (2017-12-24 15:01:56) Preventive Care 18-39 Years, Female Preventive care refers to lifestyle choices and visits with your health care provider that can promote health and wellness. What does preventive care include?   A yearly physical exam. This is also called an annual well check.  Dental exams once or twice a year.  Routine eye exams. Ask your health care provider how often you should have your eyes checked.  Personal lifestyle choices, including: ? Daily care of your teeth and gums. ? Regular physical activity. ? Eating a healthy diet. ? Avoiding tobacco and drug use. ? Limiting alcohol use. ? Practicing safe sex. ? Taking vitamin and mineral supplements as recommended by your health care provider. What happens during an annual well check? The services and screenings done by your health care provider during your annual well check will depend on your age, overall health, lifestyle risk factors, and family history of disease. Counseling Your health care provider may ask you questions about your:  Alcohol use.  Tobacco use.  Drug use.  Emotional well-being.  Home and relationship well-being.  Sexual activity.  Eating habits.  Work and work Statistician.  Method of birth control.  Menstrual cycle.  Pregnancy history. Screening You may have the following tests or measurements:  Height, weight, and BMI.  Diabetes screening. This is done by checking your blood sugar (glucose) after you have not eaten for a while (fasting).  Blood pressure.  Lipid and cholesterol levels. These may be checked every 5 years starting at age 81.  Skin check.  Hepatitis C blood test.  Hepatitis B blood test.  Sexually transmitted disease (STD) testing.  BRCA-related cancer screening. This may be done  if you have a family history of breast, ovarian, tubal, or peritoneal cancers.  Pelvic exam and Pap  test. This may be done every 3 years starting at age 39. Starting at age 72, this may be done every 5 years if you have a Pap test in combination with an HPV test. Discuss your test results, treatment options, and if necessary, the need for more tests with your health care provider. Vaccines Your health care provider may recommend certain vaccines, such as:  Influenza vaccine. This is recommended every year.  Tetanus, diphtheria, and acellular pertussis (Tdap, Td) vaccine. You may need a Td booster every 10 years.  Varicella vaccine. You may need this if you have not been vaccinated.  HPV vaccine. If you are 82 or younger, you may need three doses over 6 months.  Measles, mumps, and rubella (MMR) vaccine. You may need at least one dose of MMR. You may also need a second dose.  Pneumococcal 13-valent conjugate (PCV13) vaccine. You may need this if you have certain conditions and were not previously vaccinated.  Pneumococcal polysaccharide (PPSV23) vaccine. You may need one or two doses if you smoke cigarettes or if you have certain conditions.  Meningococcal vaccine. One dose is recommended if you are age 53-21 years and a first-year college student living in a residence hall, or if you have one of several medical conditions. You may also need additional booster doses.  Hepatitis A vaccine. You may need this if you have certain conditions or if you travel or work in places where you may be exposed to hepatitis A.  Hepatitis B vaccine. You may need this if you have certain conditions or if you travel or work in places where you may be exposed to hepatitis B.  Haemophilus influenzae type b (Hib) vaccine. You may need this if you have certain risk factors. Talk to your health care provider about which screenings and vaccines you need and how often you need them. This information is not intended to replace advice given to you by your health care provider. Make sure you discuss any questions you  have with your health care provider. Document Released: 09/10/2001 Document Revised: 02/25/2017 Document Reviewed: 05/16/2015 Elsevier Interactive Patient Education  2019 [employer].

## 2018-12-28 NOTE — Progress Notes (Signed)
Patient here for PPV, c/o feeling anxious x3 months.

## 2019-02-08 ENCOUNTER — Other Ambulatory Visit: Payer: Self-pay

## 2019-02-08 ENCOUNTER — Encounter: Payer: Self-pay | Admitting: Certified Nurse Midwife

## 2019-02-08 ENCOUNTER — Ambulatory Visit (INDEPENDENT_AMBULATORY_CARE_PROVIDER_SITE_OTHER): Payer: Medicaid Other | Admitting: Certified Nurse Midwife

## 2019-02-08 VITALS — BP 83/61 | HR 80 | Ht 63.0 in | Wt 202.9 lb

## 2019-02-08 DIAGNOSIS — O99345 Other mental disorders complicating the puerperium: Secondary | ICD-10-CM | POA: Diagnosis not present

## 2019-02-08 DIAGNOSIS — F53 Postpartum depression: Secondary | ICD-10-CM | POA: Diagnosis not present

## 2019-02-08 DIAGNOSIS — Z09 Encounter for follow-up examination after completed treatment for conditions other than malignant neoplasm: Secondary | ICD-10-CM
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
## 2019-02-08 NOTE — Progress Notes (Signed)
Pt is present for follow up for postpartum depression. Pt was started on Zooft at her last visit on 12/28/18 which pt stated it is helping her. Pt's last EPDS score was 20 today it was 13. PHQ-9=6. GAD-7= 17. Pt stated that she is feel a little better.

## 2019-02-08 NOTE — Patient Instructions (Signed)
Sertraline tablets What is this medicine? SERTRALINE (SER tra leen) is used to treat depression. It may also be used to treat obsessive compulsive disorder, panic disorder, post-trauma stress, premenstrual dysphoric disorder (PMDD) or social anxiety. This medicine may be used for other purposes; ask your health care provider or pharmacist if you have questions. COMMON BRAND NAME(S): Zoloft What should I tell my health care provider before I take this medicine? They need to know if you have any of these conditions:  bleeding disorders  bipolar disorder or a family history of bipolar disorder  glaucoma  heart disease  high blood pressure  history of irregular heartbeat  history of low levels of calcium, magnesium, or potassium in the blood  if you often drink alcohol  liver disease  receiving electroconvulsive therapy  seizures  suicidal thoughts, plans, or attempt; a previous suicide attempt by you or a family member  take medicines that treat or prevent blood clots  thyroid disease  an unusual or allergic reaction to sertraline, other medicines, foods, dyes, or preservatives  pregnant or trying to get pregnant  breast-feeding How should I use this medicine? Take this medicine by mouth with a glass of water. Follow the directions on the prescription label. You can take it with or without food. Take your medicine at regular intervals. Do not take your medicine more often than directed. Do not stop taking this medicine suddenly except upon the advice of your doctor. Stopping this medicine too quickly may cause serious side effects or your condition may worsen. A special MedGuide will be given to you by the pharmacist with each prescription and refill. Be sure to read this information carefully each time. Talk to your pediatrician regarding the use of this medicine in children. While this drug may be prescribed for children as young as 7 years for selected conditions,  precautions do apply. Overdosage: If you think you have taken too much of this medicine contact a poison control center or emergency room at once. NOTE: This medicine is only for you. Do not share this medicine with others. What if I miss a dose? If you miss a dose, take it as soon as you can. If it is almost time for your next dose, take only that dose. Do not take double or extra doses. What may interact with this medicine? Do not take this medicine with any of the following medications:  cisapride  dronedarone  linezolid  MAOIs like Carbex, Eldepryl, Marplan, Nardil, and Parnate  methylene blue (injected into a vein)  pimozide  thioridazine This medicine may also interact with the following medications:  alcohol  amphetamines  aspirin and aspirin-like medicines  certain medicines for depression, anxiety, or psychotic disturbances  certain medicines for fungal infections like ketoconazole, fluconazole, posaconazole, and itraconazole  certain medicines for irregular heart beat like flecainide, quinidine, propafenone  certain medicines for migraine headaches like almotriptan, eletriptan, frovatriptan, naratriptan, rizatriptan, sumatriptan, zolmitriptan  certain medicines for sleep  certain medicines for seizures like carbamazepine, valproic acid, phenytoin  certain medicines that treat or prevent blood clots like warfarin, enoxaparin, dalteparin  cimetidine  digoxin  diuretics  fentanyl  isoniazid  lithium  NSAIDs, medicines for pain and inflammation, like ibuprofen or naproxen  other medicines that prolong the QT interval (cause an abnormal heart rhythm) like dofetilide  rasagiline  safinamide  supplements like St. John's wort, kava kava, valerian  tolbutamide  tramadol  tryptophan This list may not describe all possible interactions. Give your health care provider  a list of all the medicines, herbs, non-prescription drugs, or dietary supplements  you use. Also tell them if you smoke, drink alcohol, or use illegal drugs. Some items may interact with your medicine. What should I watch for while using this medicine? Tell your doctor if your symptoms do not get better or if they get worse. Visit your doctor or health care professional for regular checks on your progress. Because it may take several weeks to see the full effects of this medicine, it is important to continue your treatment as prescribed by your doctor. Patients and their families should watch out for new or worsening thoughts of suicide or depression. Also watch out for sudden changes in feelings such as feeling anxious, agitated, panicky, irritable, hostile, aggressive, impulsive, severely restless, overly excited and hyperactive, or not being able to sleep. If this happens, especially at the beginning of treatment or after a change in dose, call your health care professional. Bonita QuinYou may get drowsy or dizzy. Do not drive, use machinery, or do anything that needs mental alertness until you know how this medicine affects you. Do not stand or sit up quickly, especially if you are an older patient. This reduces the risk of dizzy or fainting spells. Alcohol may interfere with the effect of this medicine. Avoid alcoholic drinks. Your mouth may get dry. Chewing sugarless gum or sucking hard candy, and drinking plenty of water may help. Contact your doctor if the problem does not go away or is severe. What side effects may I notice from receiving this medicine? Side effects that you should report to your doctor or health care professional as soon as possible:  allergic reactions like skin rash, itching or hives, swelling of the face, lips, or tongue  anxious  black, tarry stools  changes in vision  confusion  elevated mood, decreased need for sleep, racing thoughts, impulsive behavior  eye pain  fast, irregular heartbeat  feeling faint or lightheaded, falls  feeling agitated,  angry, or irritable  hallucination, loss of contact with reality  loss of balance or coordination  loss of memory  painful or prolonged erections  restlessness, pacing, inability to keep still  seizures  stiff muscles  suicidal thoughts or other mood changes  trouble sleeping  unusual bleeding or bruising  unusually weak or tired  vomiting Side effects that usually do not require medical attention (report to your doctor or health care professional if they continue or are bothersome):  change in appetite or weight  change in sex drive or performance  diarrhea  increased sweating  indigestion, nausea  tremors This list may not describe all possible side effects. Call your doctor for medical advice about side effects. You may report side effects to FDA at 1-800-FDA-1088. Where should I keep my medicine? Keep out of the reach of children. Store at room temperature between 15 and 30 degrees C (59 and 86 degrees F). Throw away any unused medicine after the expiration date. NOTE: This sheet is a summary. It may not cover all possible information. If you have questions about this medicine, talk to your doctor, pharmacist, or health care provider.  2020 Elsevier/Gold Standard (2018-07-07 10:09:27) Perinatal Anxiety When a woman feels excessive tension or worry (anxiety) during pregnancy or during the first 12 months after she gives birth, she has a condition called perinatal anxiety. Anxiety can interfere with work, school, relationships, and other everyday activities. If it is not managed properly, it can also cause problems in the mother and her  baby.  If you are pregnant and you have symptoms of an anxiety disorder, it is important to talk with your health care provider. What are the causes? The exact cause of this condition is not known. Hormonal changes during and after pregnancy may play a role in causing perinatal anxiety. What increases the risk? You are more likely  to develop this condition if:  You have a personal or family history of depression, anxiety, or mood disorders.  You experience a stressful life event during pregnancy, such as the death of a loved one.  You have a lot of regular life stress, such as being a single parent.  You have thyroid problems. What are the signs or symptoms? Perinatal anxiety can be different for everyone. It may include:  Panic attacks (panic disorder). These are intense episodes of fear or discomfort that may also cause sweating, nausea, shortness of breath, or fear of dying. They usually last 5-15 minutes.  Reliving an upsetting (traumatic) event through distressing thoughts, dreams, or flashbacks (post-traumatic stress disorder, or PTSD).  Excessive worry about multiple problems (generalized anxiety disorder).  Fear and stress about leaving certain people or loved ones (separation anxiety).  Performing repetitive tasks (compulsions) to relieve stress or worry (obsessive compulsive disorder, or OCD).  Fear of certain objects or situations (phobias).  Excessive worrying, such as a constant feeling that something bad is going to happen.  Inability to relax.  Difficulty concentrating.  Sleep problems.  Frequent nightmares or disturbing thoughts. How is this diagnosed? This condition is diagnosed based on a physical exam and mental evaluation. In some cases, your health care provider may use an anxiety screening tool. These tools include a list of questions that can help a health care provider diagnose anxiety. Your health care provider may refer you to a mental health expert who specializes in anxiety. How is this treated? This condition may be treated with:  Medicines. Your health care provider will only give you medicines that have been proven safe for pregnancy and breastfeeding.  Talk therapy with a mental health professional to help change your patterns of thinking (cognitive behavioral therapy).   Mindfulness-based stress reduction.  Other relaxation therapies, such as deep breathing or guided muscle relaxation.  Support groups. Follow these instructions at home: Lifestyle  Do not use any products that contain nicotine or tobacco, such as cigarettes and e-cigarettes. If you need help quitting, ask your health care provider.  Do not use alcohol when you are pregnant. After your baby is born, limit alcohol intake to no more than 1 drink a day. One drink equals 12 oz of beer, 5 oz of wine, or 1 oz of hard liquor.  Consider joining a support group for new mothers. Ask your health care provider for recommendations.  Take good care of yourself. Make sure you: ? Get plenty of sleep. If you are having trouble sleeping, talk with your health care provider. ? Eat a healthy diet. This includes plenty of fruits and vegetables, whole grains, and lean proteins. ? Exercise regularly, as told by your health care provider. Ask your health care provider what exercises are safe for you. General instructions  Take over-the-counter and prescription medicines only as told by your health care provider.  Talk with your partner or family members about your feelings during pregnancy. Share any concerns or fears that you may have.  Ask for help with tasks or chores when you need it. Ask friends and family members to provide meals, watch your children,  or help with cleaning.  Keep all follow-up visits as told by your health care provider. This is important. Contact a health care provider if:  You (or people close to you) notice that you have any symptoms of anxiety or depression.  You have anxiety and your symptoms get worse.  You experience side effects from medicines, such as nausea or sleep problems. Get help right away if:  You feel like hurting yourself, your baby, or someone else. If you ever feel like you may hurt yourself or others, or have thoughts about taking your own life, get help  right away. You can go to your nearest emergency department or call:  Your local emergency services (911 in the [place]  A suicide crisis helpline, such as the [facility] at (905) 847-3207. This is open 24 hours a day. Summary  Perinatal anxiety is when a woman feels excessive tension or worry during pregnancy or during the first 12 months after she gives birth.  Perinatal anxiety may include panic attacks, post-traumatic stress disorder, separation anxiety, phobias, or generalized anxiety.  Perinatal anxiety can cause physical health problems in the mother and baby if not properly managed.  This condition is treated with medicines, talk therapy, stress reduction therapies, or a combination of two or more treatments.  Talk with your partner or family members about your concerns or fears. Do not be afraid to ask for help. This information is not intended to replace advice given to you by your health care provider. Make sure you discuss any questions you have with your health care provider. Document Released: 09/11/2016 Document Revised: 07/18/2017 Document Reviewed: 09/11/2016 Elsevier Patient Education  2020 [employer].

## 2019-04-01 ENCOUNTER — Other Ambulatory Visit (HOSPITAL_COMMUNITY)
Admission: RE | Admit: 2019-04-01 | Discharge: 2019-04-01 | Disposition: A | Discharge status: Home / Self Care | Payer: Medicaid Other | Source: Ambulatory Visit | Attending: Certified Nurse Midwife | Admitting: Certified Nurse Midwife

## 2019-04-01 ENCOUNTER — Encounter: Payer: Self-pay | Admitting: Certified Nurse Midwife

## 2019-04-01 ENCOUNTER — Other Ambulatory Visit: Payer: Self-pay

## 2019-04-01 ENCOUNTER — Ambulatory Visit (INDEPENDENT_AMBULATORY_CARE_PROVIDER_SITE_OTHER): Payer: Medicaid Other | Admitting: Certified Nurse Midwife

## 2019-04-01 VITALS — BP 98/65 | HR 79 | Ht 63.0 in | Wt 212.9 lb

## 2019-04-01 DIAGNOSIS — F329 Major depressive disorder, single episode, unspecified: Secondary | ICD-10-CM

## 2019-04-01 DIAGNOSIS — Z0000 Encounter for general adult medical examination without abnormal findings: Secondary | ICD-10-CM | POA: Diagnosis not present

## 2019-04-01 DIAGNOSIS — Z01419 Encounter for gynecological examination (general) (routine) without abnormal findings: Secondary | ICD-10-CM | POA: Diagnosis not present

## 2019-04-01 DIAGNOSIS — Z8659 Personal history of other mental and behavioral disorders: Secondary | ICD-10-CM | POA: Diagnosis not present

## 2019-04-01 DIAGNOSIS — L650 Telogen effluvium: Secondary | ICD-10-CM | POA: Diagnosis not present

## 2019-04-01 DIAGNOSIS — M545 Low back pain, unspecified: Secondary | ICD-10-CM

## 2019-04-01 DIAGNOSIS — Z124 Encounter for screening for malignant neoplasm of cervix: Secondary | ICD-10-CM | POA: Insufficient documentation

## 2019-04-01 DIAGNOSIS — F32A Depression, unspecified: Secondary | ICD-10-CM

## 2019-04-01 DIAGNOSIS — N941 Unspecified dyspareunia: Secondary | ICD-10-CM

## 2019-04-01 MED ORDER — SERTRALINE HCL 25 MG PO TABS: 25.0000 mg | ORAL_TABLET | Freq: Every day | ORAL | 5 refills | Status: DC

## 2019-04-01 MED ORDER — VITAMIN D3 1.25 MG (50000 UT) PO TABS: 50000.0000 [IU] | ORAL_TABLET | ORAL | 2 refills | Status: DC

## 2019-04-01 NOTE — Progress Notes (Signed)
Patient here for annual exam.  Patient c/o pain with intercourse and hair loss x4 months.
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
## 2019-04-01 NOTE — Patient Instructions (Addendum)
Preventive Care 21-21 Years Old, Female Preventive care refers to visits with your health care provider and lifestyle choices that can promote health and wellness. This includes:  A yearly physical exam. This may also be called an annual well check.  Regular dental visits and eye exams.  Immunizations.  Screening for certain conditions.  Healthy lifestyle choices, such as eating a healthy diet, getting regular exercise, not using drugs or products that contain nicotine and tobacco, and limiting alcohol use. What can I expect for my preventive care visit? Physical exam Your health care provider will check your:  Height and weight. This may be used to calculate body mass index (BMI), which tells if you are at a healthy weight.  Heart rate and blood pressure.  Skin for abnormal spots. Counseling Your health care provider may ask you questions about your:  Alcohol, tobacco, and drug use.  Emotional well-being.  Home and relationship well-being.  Sexual activity.  Eating habits.  Work and work environment.  Method of birth control.  Menstrual cycle.  Pregnancy history. What immunizations do I need?  Influenza (flu) vaccine  This is recommended every year. Tetanus, diphtheria, and pertussis (Tdap) vaccine  You may need a Td booster every 10 years. Varicella (chickenpox) vaccine  You may need this if you have not been vaccinated. Human papillomavirus (HPV) vaccine  If recommended by your health care provider, you may need three doses over 6 months. Measles, mumps, and rubella (MMR) vaccine  You may need at least one dose of MMR. You may also need a second dose. Meningococcal conjugate (MenACWY) vaccine  One dose is recommended if you are age 19-21 years and a first-year college student living in a residence hall, or if you have one of several medical conditions. You may also need additional booster doses. Pneumococcal conjugate (PCV13) vaccine  You may need  this if you have certain conditions and were not previously vaccinated. Pneumococcal polysaccharide (PPSV23) vaccine  You may need one or two doses if you smoke cigarettes or if you have certain conditions. Hepatitis A vaccine  You may need this if you have certain conditions or if you travel or work in places where you may be exposed to hepatitis A. Hepatitis B vaccine  You may need this if you have certain conditions or if you travel or work in places where you may be exposed to hepatitis B. Haemophilus influenzae type b (Hib) vaccine  You may need this if you have certain conditions. You may receive vaccines as individual doses or as more than one vaccine together in one shot (combination vaccines). Talk with your health care provider about the risks and benefits of combination vaccines. What tests do I need?  Blood tests  Lipid and cholesterol levels. These may be checked every 5 years starting at age 20.  Hepatitis C test.  Hepatitis B test. Screening  Diabetes screening. This is done by checking your blood sugar (glucose) after you have not eaten for a while (fasting).  Sexually transmitted disease (STD) testing.  BRCA-related cancer screening. This may be done if you have a family history of breast, ovarian, tubal, or peritoneal cancers.  Pelvic exam and Pap test. This may be done every 3 years starting at age 21. Starting at age 30, this may be done every 5 years if you have a Pap test in combination with an HPV test. Talk with your health care provider about your test results, treatment options, and if necessary, the need for more tests.   Follow these instructions at home: Eating and drinking   Eat a diet that includes fresh fruits and vegetables, whole grains, lean protein, and low-fat dairy.  Take vitamin and mineral supplements as recommended by your health care provider.  Do not drink alcohol if: ? Your health care provider tells you not to drink. ? You are  pregnant, may be pregnant, or are planning to become pregnant.  If you drink alcohol: ? Limit how much you have to 0-1 drink a day. ? Be aware of how much alcohol is in your drink. In the [place] one drink equals one 12 oz bottle of beer (355 mL), one 5 oz glass of wine (148 mL), or one 1 oz glass of hard liquor (44 mL). Lifestyle  Take daily care of your teeth and gums.  Stay active. Exercise for at least 30 minutes on 5 or more days each week.  Do not use any products that contain nicotine or tobacco, such as cigarettes, e-cigarettes, and chewing tobacco. If you need help quitting, ask your health care provider.  If you are sexually active, practice safe sex. Use a condom or other form of birth control (contraception) in order to prevent pregnancy and STIs (sexually transmitted infections). If you plan to become pregnant, see your health care provider for a preconception visit. What's next?  Visit your health care provider once a year for a well check visit.  Ask your health care provider how often you should have your eyes and teeth checked.  Stay up to date on all vaccines. This information is not intended to replace advice given to you by your health care provider. Make sure you discuss any questions you have with your health care provider. Document Released: 09/10/2001 Document Revised: 03/26/2018 Document Reviewed: 03/26/2018 Elsevier Patient Education  [place]. Sertraline tablets What is this medicine? SERTRALINE (SER tra leen) is used to treat depression. It may also be used to treat obsessive compulsive disorder, panic disorder, post-trauma stress, premenstrual dysphoric disorder (PMDD) or social anxiety. This medicine may be used for other purposes; ask your health care provider or pharmacist if you have questions. COMMON BRAND NAME(S): Zoloft What should I tell my health care provider before I take this medicine? They need to know if you have any of these  conditions:  bleeding disorders  bipolar disorder or a family history of bipolar disorder  glaucoma  heart disease  high blood pressure  history of irregular heartbeat  history of low levels of calcium, magnesium, or potassium in the blood  if you often drink alcohol  liver disease  receiving electroconvulsive therapy  seizures  suicidal thoughts, plans, or attempt; a previous suicide attempt by you or a family member  take medicines that treat or prevent blood clots  thyroid disease  an unusual or allergic reaction to sertraline, other medicines, foods, dyes, or preservatives  pregnant or trying to get pregnant  breast-feeding How should I use this medicine? Take this medicine by mouth with a glass of water. Follow the directions on the prescription label. You can take it with or without food. Take your medicine at regular intervals. Do not take your medicine more often than directed. Do not stop taking this medicine suddenly except upon the advice of your doctor. Stopping this medicine too quickly may cause serious side effects or your condition may worsen. A special MedGuide will be given to you by the pharmacist with each prescription and refill. Be sure to read this information carefully each  time. Talk to your pediatrician regarding the use of this medicine in children. While this drug may be prescribed for children as young as 7 years for selected conditions, precautions do apply. Overdosage: If you think you have taken too much of this medicine contact a poison control center or emergency room at once. NOTE: This medicine is only for you. Do not share this medicine with others. What if I miss a dose? If you miss a dose, take it as soon as you can. If it is almost time for your next dose, take only that dose. Do not take double or extra doses. What may interact with this medicine? Do not take this medicine with any of the following medications:  cisapride   dronedarone  linezolid  MAOIs like Carbex, Eldepryl, Marplan, Nardil, and Parnate  methylene blue (injected into a vein)  pimozide  thioridazine This medicine may also interact with the following medications:  alcohol  amphetamines  aspirin and aspirin-like medicines  certain medicines for depression, anxiety, or psychotic disturbances  certain medicines for fungal infections like ketoconazole, fluconazole, posaconazole, and itraconazole  certain medicines for irregular heart beat like flecainide, quinidine, propafenone  certain medicines for migraine headaches like almotriptan, eletriptan, frovatriptan, naratriptan, rizatriptan, sumatriptan, zolmitriptan  certain medicines for sleep  certain medicines for seizures like carbamazepine, valproic acid, phenytoin  certain medicines that treat or prevent blood clots like warfarin, enoxaparin, dalteparin  cimetidine  digoxin  diuretics  fentanyl  isoniazid  lithium  NSAIDs, medicines for pain and inflammation, like ibuprofen or naproxen  other medicines that prolong the QT interval (cause an abnormal heart rhythm) like dofetilide  rasagiline  safinamide  supplements like St. John's wort, kava kava, valerian  tolbutamide  tramadol  tryptophan This list may not describe all possible interactions. Give your health care provider a list of all the medicines, herbs, non-prescription drugs, or dietary supplements you use. Also tell them if you smoke, drink alcohol, or use illegal drugs. Some items may interact with your medicine. What should I watch for while using this medicine? Tell your doctor if your symptoms do not get better or if they get worse. Visit your doctor or health care professional for regular checks on your progress. Because it may take several weeks to see the full effects of this medicine, it is important to continue your treatment as prescribed by your doctor. Patients and their families should  watch out for new or worsening thoughts of suicide or depression. Also watch out for sudden changes in feelings such as feeling anxious, agitated, panicky, irritable, hostile, aggressive, impulsive, severely restless, overly excited and hyperactive, or not being able to sleep. If this happens, especially at the beginning of treatment or after a change in dose, call your health care professional. Dennis Bast may get drowsy or dizzy. Do not drive, use machinery, or do anything that needs mental alertness until you know how this medicine affects you. Do not stand or sit up quickly, especially if you are an older patient. This reduces the risk of dizzy or fainting spells. Alcohol may interfere with the effect of this medicine. Avoid alcoholic drinks. Your mouth may get dry. Chewing sugarless gum or sucking hard candy, and drinking plenty of water may help. Contact your doctor if the problem does not go away or is severe. What side effects may I notice from receiving this medicine? Side effects that you should report to your doctor or health care professional as soon as possible:  allergic reactions like skin  rash, itching or hives, swelling of the face, lips, or tongue  anxious  black, tarry stools  changes in vision  confusion  elevated mood, decreased need for sleep, racing thoughts, impulsive behavior  eye pain  fast, irregular heartbeat  feeling faint or lightheaded, falls  feeling agitated, angry, or irritable  hallucination, loss of contact with reality  loss of balance or coordination  loss of memory  painful or prolonged erections  restlessness, pacing, inability to keep still  seizures  stiff muscles  suicidal thoughts or other mood changes  trouble sleeping  unusual bleeding or bruising  unusually weak or tired  vomiting Side effects that usually do not require medical attention (report to your doctor or health care professional if they continue or are bothersome):   change in appetite or weight  change in sex drive or performance  diarrhea  increased sweating  indigestion, nausea  tremors This list may not describe all possible side effects. Call your doctor for medical advice about side effects. You may report side effects to FDA at 1-800-FDA-1088. Where should I keep my medicine? Keep out of the reach of children. Store at room temperature between 15 and 30 degrees C (59 and 86 degrees F). Throw away any unused medicine after the expiration date. NOTE: This sheet is a summary. It may not cover all possible information. If you have questions about this medicine, talk to your doctor, pharmacist, or health care provider.  2020 Elsevier/Gold Standard (2018-07-07 10:09:27)  Breast Pumping Tips Breast pumping is a way to get milk out of your breasts. You will then store the milk for your baby to use when you are away from home. There are three ways to pump. You can:  Use your hand to massage and squeeze your breast (hand expression).  Use a hand-held machine to manually pump your milk.  Use an electric machine to pump your milk. In the beginning you may not get much milk. After a few days your breasts should make more. Pumping can help you start making milk after your baby is born. Pumping helps you to keep making milk when you are away from your baby. When should I pump? You can start pumping soon after your baby is born. Follow these tips:  When you are with your baby: ? Pump after you breastfeed. ? Pump from the free breast while you breastfeed.  When you are away from your baby: ? Pump every 2-3 hours for 15 minutes. ? Pump both breasts at the same time if you can.  If your baby drinks formula, pump around the time your baby gets the formula.  If you drank alcohol, wait 2 hours before you pump.  If you are going to have surgery, ask your doctor when you should pump again. How do I get ready to pump? Take steps to relax. Try these  things to help your milk come in:  Smell your baby's blanket or clothes.  Look at a picture or video of your baby.  Sit in a quiet, private space.  Massage your breast and nipple.  Place a cloth on your breast. The cloth should be warm and a little wet.  Play relaxing music.  Picture your milk flowing. What are some tips? General tips for pumping breast milk   Always wash your hands before pumping.  If you do not get much milk or if pumping hurts, try different pump settings or a different kind of pump.  Drink enough fluid so your  pee (urine) is clear or pale yellow.  Wear clothing that opens in the front or is easy to take off.  Pump milk into a clean bottle or container.  Do not use anything that has nicotine or tobacco. Examples are cigarettes and e-cigarettes. If you need help quitting, ask your doctor. Tips for storing breast milk   Store breast milk in a clean, BPA-free container. These include: ? A glass or plastic bottle. ? A milk storage bag.  Store only 2-4 ounces of breast milk in each container.  Swirl the breast milk in the container. Do not shake it.  Write down the date you pumped the milk on the container.  This is how long you can store breast milk: ? Room temperature: 6-8 hours. It is best to use the milk within 4 hours. ? Cooler with ice packs: 24 hours. ? Refrigerator: 5-8 days, if the milk is clean. It is best to use the milk within 3 days. ? Freezer: 9-12 months, if the milk is clean and stored away from the freezer door. It is best to use the milk within 6 months.  Put milk in the back of the refrigerator or freezer.  Thaw frozen milk using warm water. Do not use the microwave. Tips for choosing a breast pump When choosing a pump, keep the following things in mind:  Manual breast pumps do not need electricity. They cost less. They can be hard to use.  Electric breast pumps use electricity. They are more expensive. They are easier to use.  They collect more milk.  The suction cup (flange) should be the right size.  Before you buy the pump, check if your insurance will pay for it. Tips for caring for a breast pump  Check the manual that came with your pump for cleaning tips.  Clean the pump after you use it. To do this: 1. Wipe down the electrical part. Use a dry cloth or paper towel. Do not put this part in water or in cleaning products. 2. Wash the plastic parts with soap and warm water. Or use the dishwasher if the manual says it is safe. You do not need to clean the tubing unless it touched breast milk. 3. Let all the parts air dry. Avoid drying them with a cloth or towel. 4. When the parts are clean and dry, put the pump back together. Then store the pump.  If there is water in the tubing when you want to pump: 1. Attach the tubing to the pump. 2. Turn on the pump. 3. Turn off the pump when the tube is dry.  Try not to touch the inside of pump parts. Summary  Pumping can help you start making milk after your baby is born. It lets you keep making milk when you are away from your baby.  When you are away from your baby, pump for about 15 minutes every 2-3 hours. Pump both breasts at the same time, if you can. This information is not intended to replace advice given to you by your health care provider. Make sure you discuss any questions you have with your health care provider. Document Released: 01/01/2008 Document Revised: 11/04/2018 Document Reviewed: 08/19/2016 Elsevier Patient Education  2020 [employer].

## 2019-04-07 LAB — CYTOLOGY - PAP: Diagnosis: NEGATIVE

## 2019-05-20 ENCOUNTER — Ambulatory Visit: Payer: Medicaid Other

## 2019-05-27 ENCOUNTER — Ambulatory Visit: Payer: Medicaid Other | Attending: Certified Nurse Midwife

## 2019-06-03 ENCOUNTER — Ambulatory Visit: Payer: Medicaid Other

## 2019-06-10 ENCOUNTER — Ambulatory Visit: Payer: Medicaid Other

## 2019-06-17 ENCOUNTER — Ambulatory Visit: Payer: Medicaid Other

## 2019-07-01 ENCOUNTER — Ambulatory Visit: Payer: Medicaid Other

## 2019-07-08 ENCOUNTER — Ambulatory Visit: Payer: Medicaid Other

## 2019-07-15 ENCOUNTER — Ambulatory Visit: Payer: Medicaid Other

## 2019-09-30 ENCOUNTER — Ambulatory Visit (INDEPENDENT_AMBULATORY_CARE_PROVIDER_SITE_OTHER): Payer: Medicaid Other | Admitting: Certified Nurse Midwife

## 2019-09-30 ENCOUNTER — Encounter: Payer: Self-pay | Admitting: Certified Nurse Midwife

## 2019-09-30 ENCOUNTER — Other Ambulatory Visit: Payer: Self-pay

## 2019-09-30 DIAGNOSIS — F53 Postpartum depression: Secondary | ICD-10-CM

## 2019-09-30 DIAGNOSIS — Z3009 Encounter for other general counseling and advice on contraception: Secondary | ICD-10-CM

## 2019-09-30 DIAGNOSIS — O99345 Other mental disorders complicating the puerperium: Secondary | ICD-10-CM

## 2019-09-30 MED ORDER — SERTRALINE HCL 50 MG PO TABS: 50.0000 mg | ORAL_TABLET | Freq: Every day | ORAL | 6 refills | Status: DC

## 2019-09-30 NOTE — Patient Instructions (Addendum)
Contraception Choices Contraception, also called birth control, means things to use or ways to try not to get pregnant. Hormonal birth control This kind of birth control uses hormones. Here are some types of hormonal birth control:  A tube that is put under skin of the arm (implant). The tube can stay in for as long as 3 years.  Shots to get every 3 months (injections).  Pills to take every day (birth control pills).  A patch to change 1 time each week for 3 weeks (birth control patch). After that, the patch is taken off for 1 week.  A ring to put in the vagina. The ring is left in for 3 weeks. Then it is taken out of the vagina for 1 week. Then a new ring is put in.  Pills to take after unprotected sex (emergency birth control pills). Barrier birth control Here are some types of barrier birth control:  A thin covering that is put on the penis before sex (female condom). The covering is thrown away after sex.  A soft, loose covering that is put in the vagina before sex (female condom). The covering is thrown away after sex.  A rubber bowl that sits over the cervix (diaphragm). The bowl must be made for you. The bowl is put into the vagina before sex. The bowl is left in for 6-8 hours after sex. It is taken out within 24 hours.  A small, soft cup that fits over the cervix (cervical cap). The cup must be made for you. The cup can be left in for 6-8 hours after sex. It is taken out within 48 hours.  A sponge that is put into the vagina before sex. It must be left in for at least 6 hours after sex. It must be taken out within 30 hours. Then it is thrown away.  A chemical that kills or stops sperm from getting into the uterus (spermicide). It may be a pill, cream, jelly, or foam to put in the vagina. The chemical should be used at least 10-15 minutes before sex. IUD (intrauterine) birth control An IUD is a small, T-shaped piece of plastic. It is put inside the uterus. There are two  kinds:  Hormone IUD. This kind can stay in for 3-5 years.  Copper IUD. This kind can stay in for 10 years. Permanent birth control Here are some types of permanent birth control:  Surgery to block the fallopian tubes.  Having an insert put into each fallopian tube.  Surgery to tie off the tubes that carry sperm (vasectomy). Natural planning birth control Here are some types of natural planning birth control:  Not having sex on the days the woman could get pregnant.  Using a calendar: ? To keep track of the length of each period. ? To find out what days pregnancy can happen. ? To plan to not have sex on days when pregnancy can happen.  Watching for symptoms of ovulation and not having sex during ovulation. One way the woman can check for ovulation is to check her temperature.  Waiting to have sex until after ovulation. Summary  Contraception, also called birth control, means things to use or ways to try not to get pregnant.  Hormonal methods of birth control include implants, injections, pills, patches, vaginal rings, and emergency birth control pills.  Barrier methods of birth control can include female condoms, female condoms, diaphragms, cervical caps, sponges, and spermicides.  There are two types of IUD (intrauterine device) birth control.  An IUD can be put in a woman's uterus to prevent pregnancy for 3-5 years.  Permanent sterilization can be done through a procedure for males, females, or both.  Natural planning methods involve not having sex on the days when the woman could get pregnant. This information is not intended to replace advice given to you by your health care provider. Make sure you discuss any questions you have with your health care provider. Document Revised: 11/04/2018 Document Reviewed: 07/25/2016 Elsevier Patient Education  2020 [place] 22-19 Years Old, Female Preventive care refers to visits with your health care provider and  lifestyle choices that can promote health and wellness. This includes:  A yearly physical exam. This may also be called an annual well check.  Regular dental visits and eye exams.  Immunizations.  Screening for certain conditions.  Healthy lifestyle choices, such as eating a healthy diet, getting regular exercise, not using drugs or products that contain nicotine and tobacco, and limiting alcohol use. What can I expect for my preventive care visit? Physical exam Your health care provider will check your:  Height and weight. This may be used to calculate body mass index (BMI), which tells if you are at a healthy weight.  Heart rate and blood pressure.  Skin for abnormal spots. Counseling Your health care provider may ask you questions about your:  Alcohol, tobacco, and drug use.  Emotional well-being.  Home and relationship well-being.  Sexual activity.  Eating habits.  Work and work Statistician.  Method of birth control.  Menstrual cycle.  Pregnancy history. What immunizations do I need?  Influenza (flu) vaccine  This is recommended every year. Tetanus, diphtheria, and pertussis (Tdap) vaccine  You may need a Td booster every 10 years. Varicella (chickenpox) vaccine  You may need this if you have not been vaccinated. Human papillomavirus (HPV) vaccine  If recommended by your health care provider, you may need three doses over 6 months. Measles, mumps, and rubella (MMR) vaccine  You may need at least one dose of MMR. You may also need a second dose. Meningococcal conjugate (MenACWY) vaccine  One dose is recommended if you are age 22-21 years and a first-year college student living in a residence hall, or if you have one of several medical conditions. You may also need additional booster doses. Pneumococcal conjugate (PCV13) vaccine  You may need this if you have certain conditions and were not previously vaccinated. Pneumococcal polysaccharide (PPSV23)  vaccine  You may need one or two doses if you smoke cigarettes or if you have certain conditions. Hepatitis A vaccine  You may need this if you have certain conditions or if you travel or work in places where you may be exposed to hepatitis A. Hepatitis B vaccine  You may need this if you have certain conditions or if you travel or work in places where you may be exposed to hepatitis B. Haemophilus influenzae type b (Hib) vaccine  You may need this if you have certain conditions. You may receive vaccines as individual doses or as more than one vaccine together in one shot (combination vaccines). Talk with your health care provider about the risks and benefits of combination vaccines. What tests do I need?  Blood tests  Lipid and cholesterol levels. These may be checked every 5 years starting at age 59.  Hepatitis C test.  Hepatitis B test. Screening  Diabetes screening. This is done by checking your blood sugar (glucose) after you have not eaten for a  while (fasting).  Sexually transmitted disease (STD) testing.  BRCA-related cancer screening. This may be done if you have a family history of breast, ovarian, tubal, or peritoneal cancers.  Pelvic exam and Pap test. This may be done every 3 years starting at age 55. Starting at age 53, this may be done every 5 years if you have a Pap test in combination with an HPV test. Talk with your health care provider about your test results, treatment options, and if necessary, the need for more tests. Follow these instructions at home: Eating and drinking   Eat a diet that includes fresh fruits and vegetables, whole grains, lean protein, and low-fat dairy.  Take vitamin and mineral supplements as recommended by your health care provider.  Do not drink alcohol if: ? Your health care provider tells you not to drink. ? You are pregnant, may be pregnant, or are planning to become pregnant.  If you drink alcohol: ? Limit how much you have  to 0-1 drink a day. ? Be aware of how much alcohol is in your drink. In the [place] one drink equals one 12 oz bottle of beer (355 mL), one 5 oz glass of wine (148 mL), or one 1 oz glass of hard liquor (44 mL). Lifestyle  Take daily care of your teeth and gums.  Stay active. Exercise for at least 30 minutes on 5 or more days each week.  Do not use any products that contain nicotine or tobacco, such as cigarettes, e-cigarettes, and chewing tobacco. If you need help quitting, ask your health care provider.  If you are sexually active, practice safe sex. Use a condom or other form of birth control (contraception) in order to prevent pregnancy and STIs (sexually transmitted infections). If you plan to become pregnant, see your health care provider for a preconception visit. What's next?  Visit your health care provider once a year for a well check visit.  Ask your health care provider how often you should have your eyes and teeth checked.  Stay up to date on all vaccines. This information is not intended to replace advice given to you by your health care provider. Make sure you discuss any questions you have with your health care provider. Document Revised: 03/26/2018 Document Reviewed: 03/26/2018 Elsevier Patient Education  [place]. Sertraline tablets What is this medicine? SERTRALINE (SER tra leen) is used to treat depression. It may also be used to treat obsessive compulsive disorder, panic disorder, post-trauma stress, premenstrual dysphoric disorder (PMDD) or social anxiety. This medicine may be used for other purposes; ask your health care provider or pharmacist if you have questions. COMMON BRAND NAME(S): Zoloft What should I tell my health care provider before I take this medicine? They need to know if you have any of these conditions:  bleeding disorders  bipolar disorder or a family history of bipolar disorder  glaucoma  heart disease  high blood  pressure  history of irregular heartbeat  history of low levels of calcium, magnesium, or potassium in the blood  if you often drink alcohol  liver disease  receiving electroconvulsive therapy  seizures  suicidal thoughts, plans, or attempt; a previous suicide attempt by you or a family member  take medicines that treat or prevent blood clots  thyroid disease  an unusual or allergic reaction to sertraline, other medicines, foods, dyes, or preservatives  pregnant or trying to get pregnant  breast-feeding How should I use this medicine? Take this medicine by mouth with a  glass of water. Follow the directions on the prescription label. You can take it with or without food. Take your medicine at regular intervals. Do not take your medicine more often than directed. Do not stop taking this medicine suddenly except upon the advice of your doctor. Stopping this medicine too quickly may cause serious side effects or your condition may worsen. A special MedGuide will be given to you by the pharmacist with each prescription and refill. Be sure to read this information carefully each time. Talk to your pediatrician regarding the use of this medicine in children. While this drug may be prescribed for children as young as 7 years for selected conditions, precautions do apply. Overdosage: If you think you have taken too much of this medicine contact a poison control center or emergency room at once. NOTE: This medicine is only for you. Do not share this medicine with others. What if I miss a dose? If you miss a dose, take it as soon as you can. If it is almost time for your next dose, take only that dose. Do not take double or extra doses. What may interact with this medicine? Do not take this medicine with any of the following medications:  cisapride  dronedarone  linezolid  MAOIs like Carbex, Eldepryl, Marplan, Nardil, and Parnate  methylene blue (injected into a  vein)  pimozide  thioridazine This medicine may also interact with the following medications:  alcohol  amphetamines  aspirin and aspirin-like medicines  certain medicines for depression, anxiety, or psychotic disturbances  certain medicines for fungal infections like ketoconazole, fluconazole, posaconazole, and itraconazole  certain medicines for irregular heart beat like flecainide, quinidine, propafenone  certain medicines for migraine headaches like almotriptan, eletriptan, frovatriptan, naratriptan, rizatriptan, sumatriptan, zolmitriptan  certain medicines for sleep  certain medicines for seizures like carbamazepine, valproic acid, phenytoin  certain medicines that treat or prevent blood clots like warfarin, enoxaparin, dalteparin  cimetidine  digoxin  diuretics  fentanyl  isoniazid  lithium  NSAIDs, medicines for pain and inflammation, like ibuprofen or naproxen  other medicines that prolong the QT interval (cause an abnormal heart rhythm) like dofetilide  rasagiline  safinamide  supplements like St. John's wort, kava kava, valerian  tolbutamide  tramadol  tryptophan This list may not describe all possible interactions. Give your health care provider a list of all the medicines, herbs, non-prescription drugs, or dietary supplements you use. Also tell them if you smoke, drink alcohol, or use illegal drugs. Some items may interact with your medicine. What should I watch for while using this medicine? Tell your doctor if your symptoms do not get better or if they get worse. Visit your doctor or health care professional for regular checks on your progress. Because it may take several weeks to see the full effects of this medicine, it is important to continue your treatment as prescribed by your doctor. Patients and their families should watch out for new or worsening thoughts of suicide or depression. Also watch out for sudden changes in feelings such as  feeling anxious, agitated, panicky, irritable, hostile, aggressive, impulsive, severely restless, overly excited and hyperactive, or not being able to sleep. If this happens, especially at the beginning of treatment or after a change in dose, call your health care professional. Dennis Bast may get drowsy or dizzy. Do not drive, use machinery, or do anything that needs mental alertness until you know how this medicine affects you. Do not stand or sit up quickly, especially if you are an older  patient. This reduces the risk of dizzy or fainting spells. Alcohol may interfere with the effect of this medicine. Avoid alcoholic drinks. Your mouth may get dry. Chewing sugarless gum or sucking hard candy, and drinking plenty of water may help. Contact your doctor if the problem does not go away or is severe. What side effects may I notice from receiving this medicine? Side effects that you should report to your doctor or health care professional as soon as possible:  allergic reactions like skin rash, itching or hives, swelling of the face, lips, or tongue  anxious  black, tarry stools  changes in vision  confusion  elevated mood, decreased need for sleep, racing thoughts, impulsive behavior  eye pain  fast, irregular heartbeat  feeling faint or lightheaded, falls  feeling agitated, angry, or irritable  hallucination, loss of contact with reality  loss of balance or coordination  loss of memory  painful or prolonged erections  restlessness, pacing, inability to keep still  seizures  stiff muscles  suicidal thoughts or other mood changes  trouble sleeping  unusual bleeding or bruising  unusually weak or tired  vomiting Side effects that usually do not require medical attention (report to your doctor or health care professional if they continue or are bothersome):  change in appetite or weight  change in sex drive or performance  diarrhea  increased sweating  indigestion,  nausea  tremors This list may not describe all possible side effects. Call your doctor for medical advice about side effects. You may report side effects to FDA at 1-800-FDA-1088. Where should I keep my medicine? Keep out of the reach of children. Store at room temperature between 15 and 30 degrees C (59 and 86 degrees F). Throw away any unused medicine after the expiration date. NOTE: This sheet is a summary. It may not cover all possible information. If you have questions about this medicine, talk to your doctor, pharmacist, or health care provider.  2020 Elsevier/Gold Standard (2018-07-07 10:09:27) Levonorgestrel intrauterine device (IUD) What is this medicine? LEVONORGESTREL IUD (LEE voe nor jes trel) is a contraceptive (birth control) device. The device is placed inside the uterus by a healthcare professional. It is used to prevent pregnancy. This device can also be used to treat heavy bleeding that occurs during your period. This medicine may be used for other purposes; ask your health care provider or pharmacist if you have questions. COMMON BRAND NAME(S): Minette [place]land What should I tell my health care provider before I take this medicine? They need to know if you have any of these conditions:  abnormal Pap smear  cancer of the breast, uterus, or cervix  diabetes  endometritis  genital or pelvic infection now or in the past  have more than one sexual partner or your partner has more than one partner  heart disease  history of an ectopic or tubal pregnancy  immune system problems  IUD in place  liver disease or tumor  problems with blood clots or take blood-thinners  seizures  use intravenous drugs  uterus of unusual shape  vaginal bleeding that has not been explained  an unusual or allergic reaction to levonorgestrel, other hormones, silicone, or polyethylene, medicines, foods, dyes, or preservatives  pregnant or trying to get  pregnant  breast-feeding How should I use this medicine? This device is placed inside the uterus by a health care professional. Talk to your pediatrician regarding the use of this medicine in children. Special care may be needed. Overdosage:  If you think you have taken too much of this medicine contact a poison control center or emergency room at once. NOTE: This medicine is only for you. Do not share this medicine with others. What if I miss a dose? This does not apply. Depending on the brand of device you have inserted, the device will need to be replaced every 3 to 6 years if you wish to continue using this type of birth control. What may interact with this medicine? Do not take this medicine with any of the following medications:  amprenavir  bosentan  fosamprenavir This medicine may also interact with the following medications:  aprepitant  armodafinil  barbiturate medicines for inducing sleep or treating seizures  bexarotene  boceprevir  griseofulvin  medicines to treat seizures like carbamazepine, ethotoin, felbamate, oxcarbazepine, phenytoin, topiramate  modafinil  pioglitazone  rifabutin  rifampin  rifapentine  some medicines to treat HIV infection like atazanavir, efavirenz, indinavir, lopinavir, nelfinavir, tipranavir, ritonavir  St. John's wort  warfarin This list may not describe all possible interactions. Give your health care provider a list of all the medicines, herbs, non-prescription drugs, or dietary supplements you use. Also tell them if you smoke, drink alcohol, or use illegal drugs. Some items may interact with your medicine. What should I watch for while using this medicine? Visit your doctor or health care professional for regular check ups. See your doctor if you or your partner has sexual contact with others, becomes HIV positive, or gets a sexual transmitted disease. This product does not protect you against HIV infection (AIDS) or other  sexually transmitted diseases. You can check the placement of the IUD yourself by reaching up to the top of your vagina with clean fingers to feel the threads. Do not pull on the threads. It is a good habit to check placement after each menstrual period. Call your doctor right away if you feel more of the IUD than just the threads or if you cannot feel the threads at all. The IUD may come out by itself. You may become pregnant if the device comes out. If you notice that the IUD has come out use a backup birth control method like condoms and call your health care provider. Using tampons will not change the position of the IUD and are okay to use during your period. This IUD can be safely scanned with magnetic resonance imaging (MRI) only under specific conditions. Before you have an MRI, tell your healthcare provider that you have an IUD in place, and which type of IUD you have in place. What side effects may I notice from receiving this medicine? Side effects that you should report to your doctor or health care professional as soon as possible:  allergic reactions like skin rash, itching or hives, swelling of the face, lips, or tongue  fever, flu-like symptoms  genital sores  high blood pressure  no menstrual period for 6 weeks during use  pain, swelling, warmth in the leg  pelvic pain or tenderness  severe or sudden headache  signs of pregnancy  stomach cramping  sudden shortness of breath  trouble with balance, talking, or walking  unusual vaginal bleeding, discharge  yellowing of the eyes or skin Side effects that usually do not require medical attention (report to your doctor or health care professional if they continue or are bothersome):  acne  breast pain  change in sex drive or performance  changes in weight  cramping, dizziness, or faintness while the device is being  inserted  headache  irregular menstrual bleeding within first 3 to 6 months of  use  nausea This list may not describe all possible side effects. Call your doctor for medical advice about side effects. You may report side effects to FDA at 1-800-FDA-1088. Where should I keep my medicine? This does not apply. NOTE: This sheet is a summary. It may not cover all possible information. If you have questions about this medicine, talk to your doctor, pharmacist, or health care provider.  2020 Elsevier/Gold Standard (2018-05-26 13:22:01)

## 2019-10-03 MED ORDER — MISOPROSTOL 200 MCG PO TABS: ORAL_TABLET | ORAL | 0 refills | Status: DC

## 2019-10-03 NOTE — Progress Notes (Signed)
GYN ENCOUNTER NOTE  Subjective:       Felicia Scott is a 24 y.o. [phone] female is here for gynecologic evaluation of the following issues:  1. Missing pills frequently-desires IUD placement 2. Anxiety-taking Zoloft 50 mg daily; feeling good, better  Denies difficulty breathing or respiratory distress, chest pain, abdominal pain, excessive vaginal bleeding, dysuria, and leg pain or swelling.    Gynecologic History  Patient's last menstrual period was 09/20/2019 (approximate).  Contraception: OCP (estrogen/progesterone), Lo Loestrin  Last Pap: 03/2019. Results were: normal  Obstetric History  OB History  Gravida Para Term Preterm AB Living  5 1 1   4 1   SAB TAB Ectopic Multiple Live Births  3 1     1     # Outcome Date GA Lbr Len/2nd Weight Sex Delivery Anes PTL Lv  5 Term 11/16/18 [redacted]w[redacted]d  6 lb 9 oz (2.977 kg) M Vag-Spont EPI N LIV     Complications: Oligohydramnios delivered  4 SAB 12/27/17          3 SAB           2 SAB           1 TAB             Past Medical History:  Diagnosis Date  . Asthma   . Depression     Past Surgical History:  Procedure Laterality Date  . APPENDECTOMY      Current Outpatient Medications on File Prior to Visit  Medication Sig Dispense Refill  . Drospirenone (SLYND) 4 MG TABS Take 1 tablet by mouth daily. 84 tablet 3  . Cholecalciferol (VITAMIN D3) 1.25 MG (50000 UT) TABS Take 50,000 Units by mouth once a week. (Patient not taking: Reported on 09/30/2019) 12 tablet 2  . Prenatal Vit-Fe Phos-FA-Omega (VITAFOL GUMMIES) 3.33-0.333-34.8 MG CHEW Chew 1 tablet by mouth 2 (two) times daily. (Patient not taking: Reported on 09/30/2019) 90 tablet 4   No current facility-administered medications on file prior to visit.    No Known Allergies  Social History   Socioeconomic History  . Marital status: Single    Spouse name: Not on file  . Number of children: Not on file  . Years of education: Not on file  . Highest education level: Not on file   Occupational History  . Not on file  Tobacco Use  . Smoking status: Former Research scientist (life sciences)  . Smokeless tobacco: Never Used  Substance and Sexual Activity  . Alcohol use: Yes    Comment: occasional   . Drug use: Not Currently    Types: Marijuana  . Sexual activity: Yes    Birth control/protection: Pill, Condom  Other Topics Concern  . Not on file  Social History Narrative  . Not on file   Social Determinants of Health   Financial Resource Strain:   . Difficulty of Paying Living Expenses: Not on file  Food Insecurity:   . Worried About Charity fundraiser in the Last Year: Not on file  . Ran Out of Food in the Last Year: Not on file  Transportation Needs:   . Lack of Transportation (Medical): Not on file  . Lack of Transportation (Non-Medical): Not on file  Physical Activity:   . Days of Exercise per Week: Not on file  . Minutes of Exercise per Session: Not on file  Stress:   . Feeling of Stress : Not on file  Social Connections:   . Frequency of Communication with Friends and Family:  Not on file  . Frequency of Social Gatherings with Friends and Family: Not on file  . Attends Religious Services: Not on file  . Active Member of Clubs or Organizations: Not on file  . Attends Banker Meetings: Not on file  . Marital Status: Not on file  Intimate Partner Violence:   . Fear of Current or Ex-Partner: Not on file  . Emotionally Abused: Not on file  . Physically Abused: Not on file  . Sexually Abused: Not on file    Family History  Problem Relation Age of Onset  . Healthy Mother   . Healthy Father   . Breast cancer Maternal Grandmother   . Hypertension Neg Hx   . Diabetes Neg Hx   . Ovarian cancer Neg Hx   . Colon cancer Neg Hx     The following portions of the patient's history were reviewed and updated as appropriate: allergies, current medications, past family history, past medical history, past social history, past surgical history and problem  list.  Review of Systems  ROS negative except as noted above. Information obtained from patient.   Objective:   BP 100/64   Pulse 77   Ht 5\' 3"  (1.6 m)   Wt 201 lb 3 oz (91.3 kg)   LMP 09/20/2019 (Approximate)   BMI 35.64 kg/m   CONSTITUTIONAL: Well-developed, well-nourished female in no acute distress.   Depression screen [facility] 2/9 09/30/2019 04/01/2019 02/08/2019 12/28/2018 06/03/2018  Decreased Interest 0 2 2 2 1   Down, Depressed, Hopeless 0 1 0 3 2  PHQ - 2 Score 0 3 2 5 3   Altered sleeping 0 1 2 3 2   Tired, decreased energy 0 1 2 3 2   Change in appetite 1 1 0 3 2  Feeling bad or failure about yourself  0 0 0 0 1  Trouble concentrating 0 0 0 3 -  Moving slowly or fidgety/restless 0 0 0 3 0  Suicidal thoughts 0 0 0 0 0  PHQ-9 Score 1 6 6 20 10   Difficult doing work/chores Not difficult at all Not difficult at all Not difficult at all Somewhat difficult Somewhat difficult   GAD 7 : Generalized Anxiety Score 09/30/2019 02/08/2019 12/28/2018  Nervous, Anxious, on Edge 1 2 3   Control/stop worrying 1 3 3   Worry too much - different things 3 3 3   Trouble relaxing 1 3 3   Restless 0 2 3  Easily annoyed or irritable 0 1 3  Afraid - awful might happen 1 3 3   Total GAD 7 Score 7 17 21   Anxiety Difficulty Not difficult at all Somewhat difficult Not difficult at all    Assessment:   1. Lactating mother  - POCT urine pregnancy  2. Postpartum depression   3. Encounter for counseling regarding contraception   Plan:   Discussed IUD options, patient verbalized understanding. Desires Mirena placement.   Rx Cytotec and Zoloft, see orders.   Reviewed red flag symptoms and when to call.   RTC for IUD placement.    , CNM [facility], [facility]

## 2019-10-04 ENCOUNTER — Ambulatory Visit: Payer: Medicaid Other | Admitting: Certified Nurse Midwife

## 2019-10-15 ENCOUNTER — Other Ambulatory Visit: Payer: Self-pay

## 2019-10-15 ENCOUNTER — Encounter: Payer: Self-pay | Admitting: Certified Nurse Midwife

## 2019-10-15 ENCOUNTER — Ambulatory Visit (INDEPENDENT_AMBULATORY_CARE_PROVIDER_SITE_OTHER): Payer: Medicaid Other | Admitting: Certified Nurse Midwife

## 2019-10-15 VITALS — BP 96/69 | HR 81 | Ht 64.0 in | Wt 199.2 lb

## 2019-10-15 DIAGNOSIS — Z3202 Encounter for pregnancy test, result negative: Secondary | ICD-10-CM

## 2019-10-15 DIAGNOSIS — Z30430 Encounter for insertion of intrauterine contraceptive device: Secondary | ICD-10-CM | POA: Diagnosis not present

## 2019-10-15 LAB — POCT URINE PREGNANCY: Preg Test, Ur: NEGATIVE

## 2019-10-15 NOTE — Patient Instructions (Signed)
IUD PLACEMENT POST-PROCEDURE INSTRUCTIONS  1. You may take Ibuprofen, Aleve or Tylenol for pain if needed.  Cramping should resolve within in 24 hours.  2. You may have a small amount of spotting.  You should wear a mini pad for the next few days.  3. You may have intercourse after 72 hours.  If you using this for birth control, it is effective immediately.  4. You need to call if you have any pelvic pain, fever, heavy bleeding or foul smelling vaginal discharge.  Irregular bleeding is common the first several months after having an IUD placed. You do not need to call for this reason unless you are concerned.  5. Shower or bathe as normal  6. You should have a follow-up appointment in 4-8 weeks for a re-check to make sure you are not having any problems.   Levonorgestrel intrauterine device (IUD) What is this medicine? LEVONORGESTREL IUD (LEE voe nor jes trel) is a contraceptive (birth control) device. The device is placed inside the uterus by a healthcare professional. It is used to prevent pregnancy. This device can also be used to treat heavy bleeding that occurs during your period. This medicine may be used for other purposes; ask your health care provider or pharmacist if you have questions. COMMON BRAND NAME(S): Kyleena, LILETTA, Mirena, Skyla What should I tell my health care provider before I take this medicine? They need to know if you have any of these conditions:  abnormal Pap smear  cancer of the breast, uterus, or cervix  diabetes  endometritis  genital or pelvic infection now or in the past  have more than one sexual partner or your partner has more than one partner  heart disease  history of an ectopic or tubal pregnancy  immune system problems  IUD in place  liver disease or tumor  problems with blood clots or take blood-thinners  seizures  use intravenous drugs  uterus of unusual shape  vaginal bleeding that has not been explained  an unusual or  allergic reaction to levonorgestrel, other hormones, silicone, or polyethylene, medicines, foods, dyes, or preservatives  pregnant or trying to get pregnant  breast-feeding How should I use this medicine? This device is placed inside the uterus by a health care professional. Talk to your pediatrician regarding the use of this medicine in children. Special care may be needed. Overdosage: If you think you have taken too much of this medicine contact a poison control center or emergency room at once. NOTE: This medicine is only for you. Do not share this medicine with others. What if I miss a dose? This does not apply. Depending on the brand of device you have inserted, the device will need to be replaced every 3 to 6 years if you wish to continue using this type of birth control. What may interact with this medicine? Do not take this medicine with any of the following medications:  amprenavir  bosentan  fosamprenavir This medicine may also interact with the following medications:  aprepitant  armodafinil  barbiturate medicines for inducing sleep or treating seizures  bexarotene  boceprevir  griseofulvin  medicines to treat seizures like carbamazepine, ethotoin, felbamate, oxcarbazepine, phenytoin, topiramate  modafinil  pioglitazone  rifabutin  rifampin  rifapentine  some medicines to treat HIV infection like atazanavir, efavirenz, indinavir, lopinavir, nelfinavir, tipranavir, ritonavir  St. John's wort  warfarin This list may not describe all possible interactions. Give your health care provider a list of all the medicines, herbs, non-prescription drugs, or   dietary supplements you use. Also tell them if you smoke, drink alcohol, or use illegal drugs. Some items may interact with your medicine. What should I watch for while using this medicine? Visit your doctor or health care professional for regular check ups. See your doctor if you or your partner has sexual  contact with others, becomes HIV positive, or gets a sexual transmitted disease. This product does not protect you against HIV infection (AIDS) or other sexually transmitted diseases. You can check the placement of the IUD yourself by reaching up to the top of your vagina with clean fingers to feel the threads. Do not pull on the threads. It is a good habit to check placement after each menstrual period. Call your doctor right away if you feel more of the IUD than just the threads or if you cannot feel the threads at all. The IUD may come out by itself. You may become pregnant if the device comes out. If you notice that the IUD has come out use a backup birth control method like condoms and call your health care provider. Using tampons will not change the position of the IUD and are okay to use during your period. This IUD can be safely scanned with magnetic resonance imaging (MRI) only under specific conditions. Before you have an MRI, tell your healthcare provider that you have an IUD in place, and which type of IUD you have in place. What side effects may I notice from receiving this medicine? Side effects that you should report to your doctor or health care professional as soon as possible:  allergic reactions like skin rash, itching or hives, swelling of the face, lips, or tongue  fever, flu-like symptoms  genital sores  high blood pressure  no menstrual period for 6 weeks during use  pain, swelling, warmth in the leg  pelvic pain or tenderness  severe or sudden headache  signs of pregnancy  stomach cramping  sudden shortness of breath  trouble with balance, talking, or walking  unusual vaginal bleeding, discharge  yellowing of the eyes or skin Side effects that usually do not require medical attention (report to your doctor or health care professional if they continue or are bothersome):  acne  breast pain  change in sex drive or performance  changes in  weight  cramping, dizziness, or faintness while the device is being inserted  headache  irregular menstrual bleeding within first 3 to 6 months of use  nausea This list may not describe all possible side effects. Call your doctor for medical advice about side effects. You may report side effects to FDA at 1-800-FDA-1088. Where should I keep my medicine? This does not apply. NOTE: This sheet is a summary. It may not cover all possible information. If you have questions about this medicine, talk to your doctor, pharmacist, or health care provider.  2020 Elsevier/Gold Standard (2018-05-26 13:22:01)  

## 2019-10-15 NOTE — Progress Notes (Signed)
Felicia Scott is a 24 y.o. year old [id number] Hispanic female who presents for placement of a Mirena IUD.  BP 96/69   Pulse 81   Ht 5\' 4"  (1.626 m)   Wt 199 lb 3.2 oz (90.4 kg)   LMP 09/20/2019 (Approximate)   Breastfeeding No   BMI 34.19 kg/m   Pregnancy test today was negative.   The risks and benefits of the method and placement have been thouroughly reviewed with the patient and all questions were answered.  Specifically the patient is aware of failure rate of 07/998, expulsion of the IUD and of possible perforation.  The patient is aware of irregular bleeding due to the method and understands the incidence of irregular bleeding diminishes with time.  Signed copy of informed consent in chart.   Time out was performed.  A small plastic speculum was placed in the vagina.  The cervix was visualized, prepped using Betadine, and grasped with a single tooth tenaculum. The uterus was  sounded to 9 cm.  Mirena IUD placed per manufacturer's recommendations.   The strings were trimmed to 3 cm.  The patient was given post procedure instructions, including signs and symptoms of infection and to check for the strings after each menses or each month, and refraining from intercourse or anything in the vagina for 3 days.  She was given a Mirena care card with date Mirena placed, and date Mirena to be removed.  Reviewed red flag symptoms and when to call.   RTC x 4-8 weeks for IUD string check or sooner if needed.    09/22/2019, CNM [facility], [facility] 10/15/19 10:55 AM  NDC: 10/17/19 Lot: [id number] Exp: 11/2021

## 2019-10-15 NOTE — Progress Notes (Signed)
Patient here for Mirena IUD insertion, no complaints.

## 2019-10-22 IMAGING — US US OB < 14 WEEKS - US OB TV
1 series · 14 of 28 positions shown · non-contrast
Comparison: None.

CLINICAL DATA: Vaginal bleeding. Abdominal cramping. Symptoms for 3
By the patient's LMP, EDC is 12/14/2018. LMP 03/09/2018.

EXAM:
OBSTETRIC <14 WK US AND TRANSVAGINAL OB US
TECHNIQUE: Both transabdominal and transvaginal ultrasound examinations were
performed for complete evaluation of the gestation as well as the
maternal uterus, adnexal regions, and pelvic cul-de-sac.
Transvaginal technique was performed to assess early pregnancy.

[Series 1: us ob < 14 weeks - us ob tv · 14 of 133 slices shown]
[im 5/133]
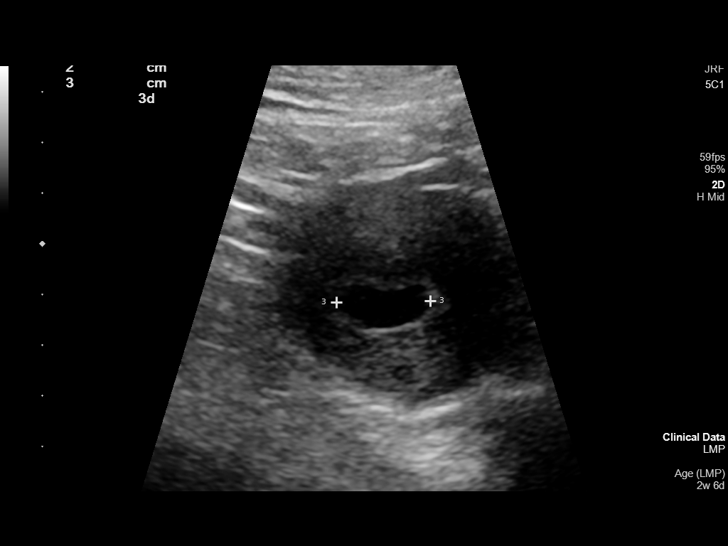
[im 15/133]
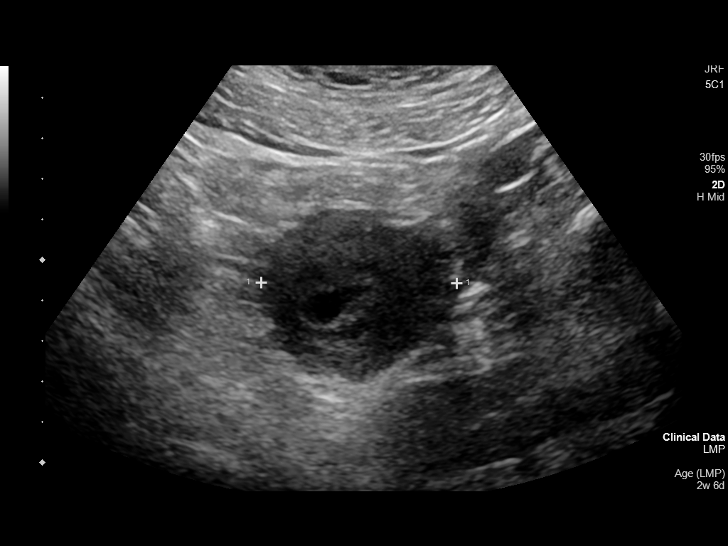
[im 25/133]
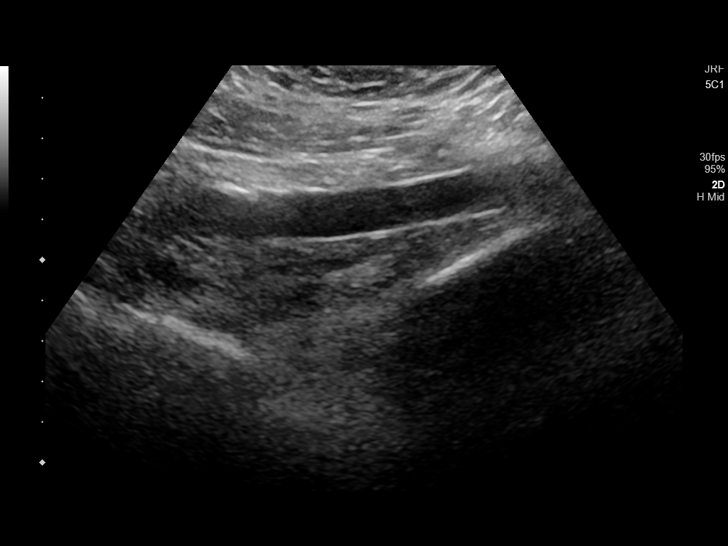
[im 35/133]
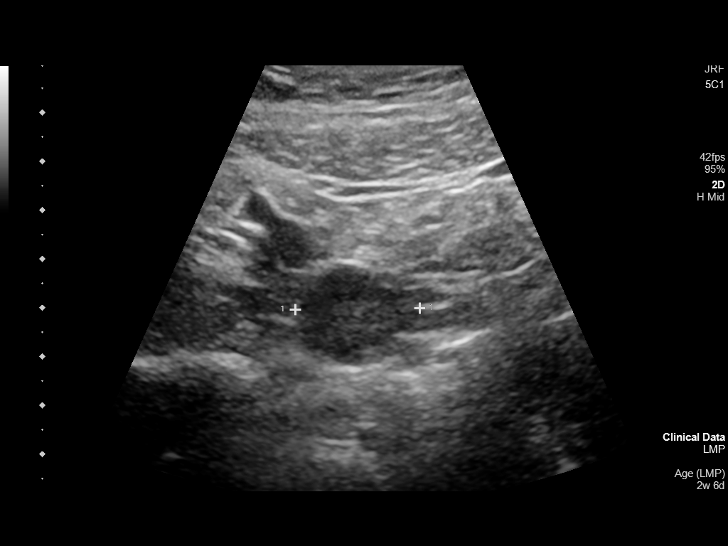
[im 45/133]
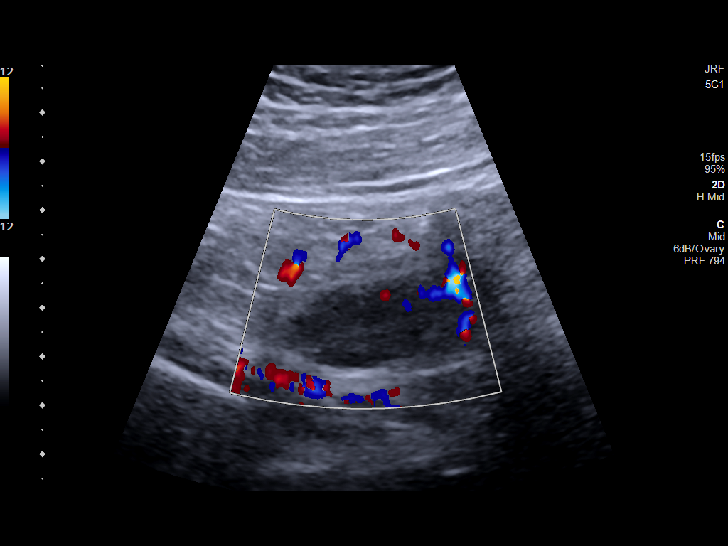
[im 54/133]
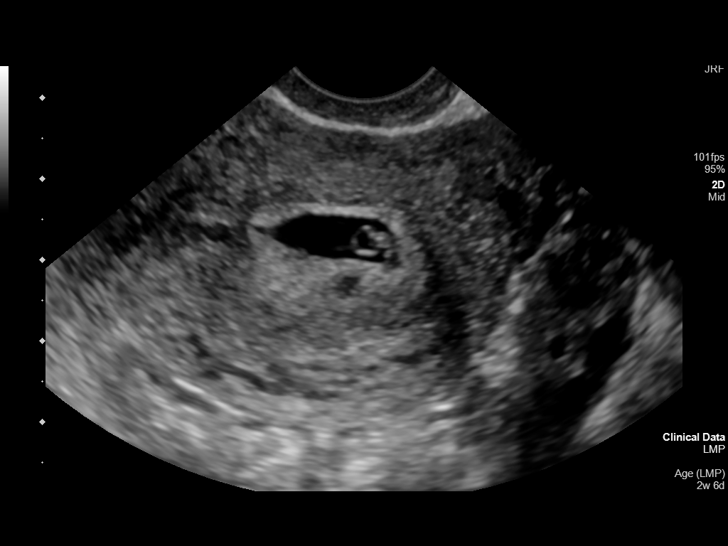
[im 64/133]
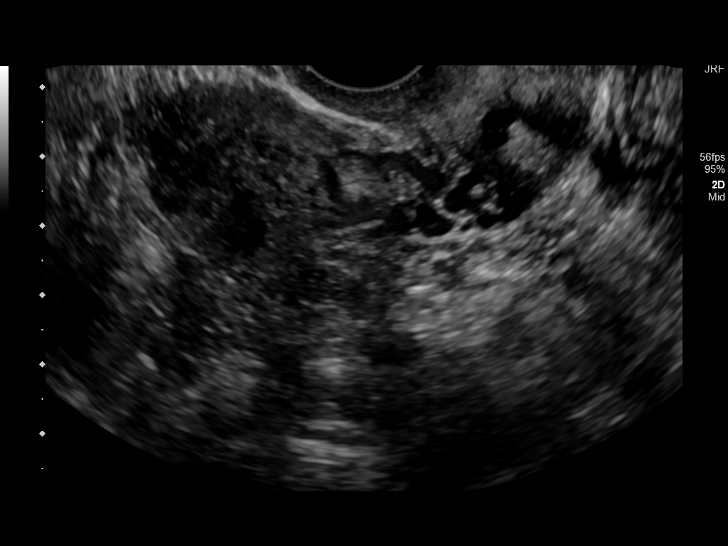
[im 74/133]
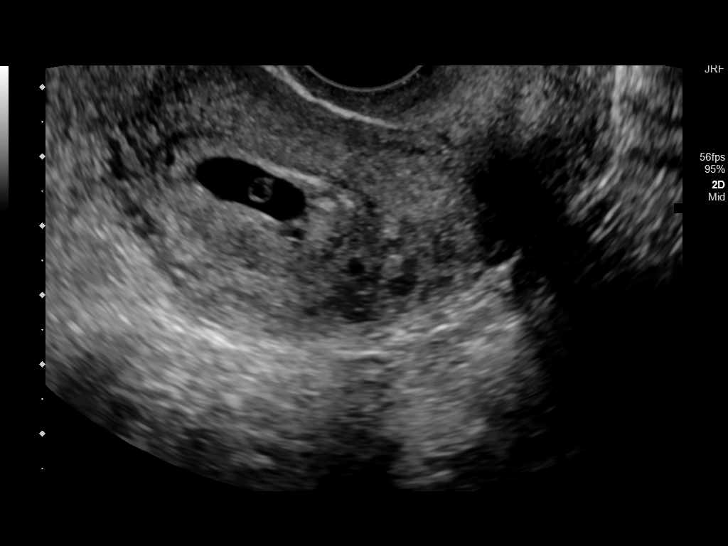
[im 84/133]
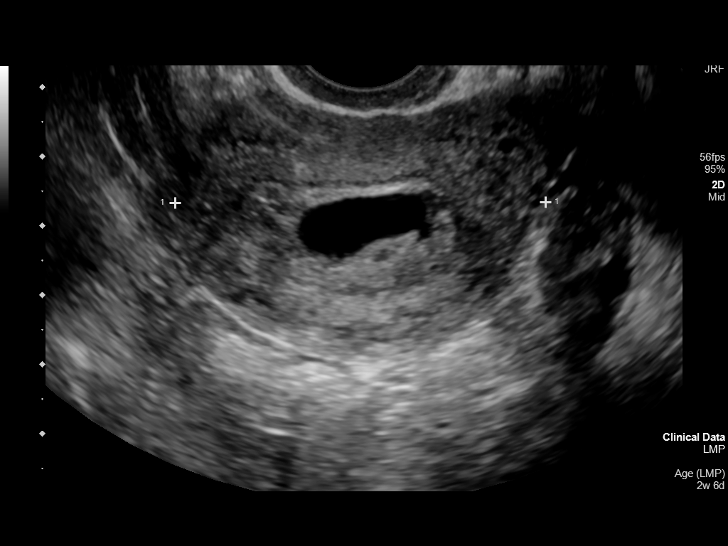
[im 93/133]
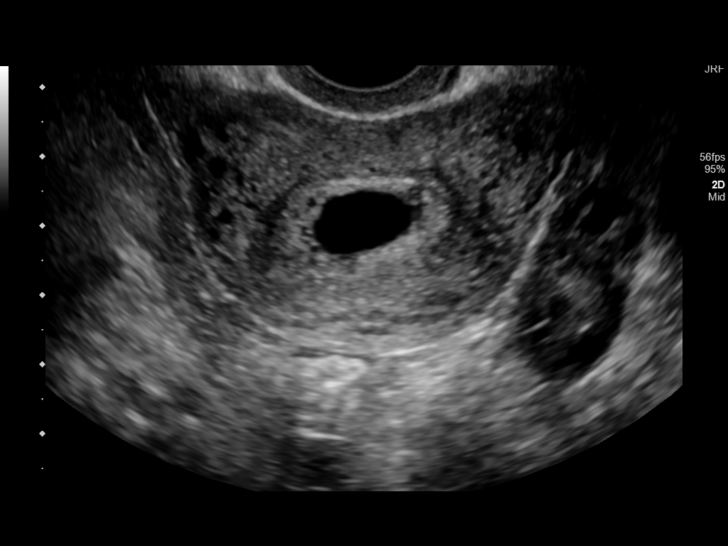
[im 103/133]
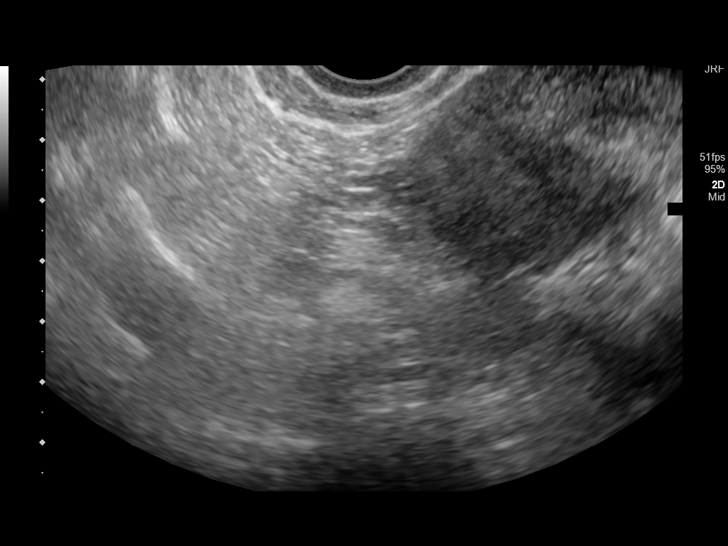
[im 113/133]
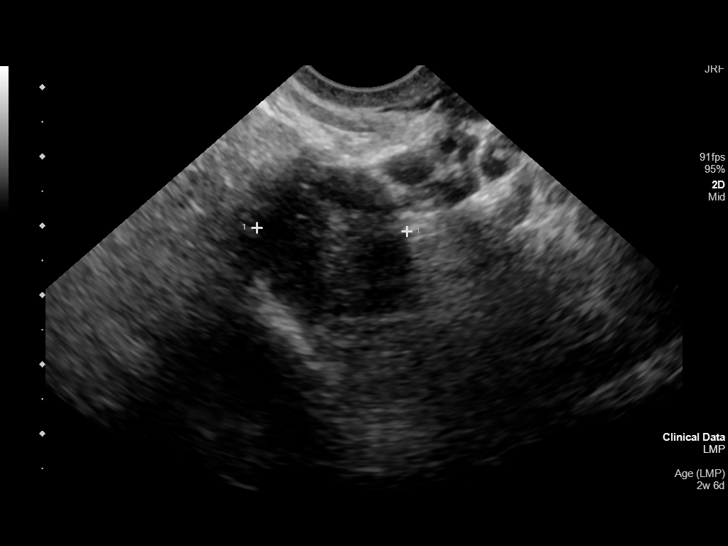
[im 123/133]
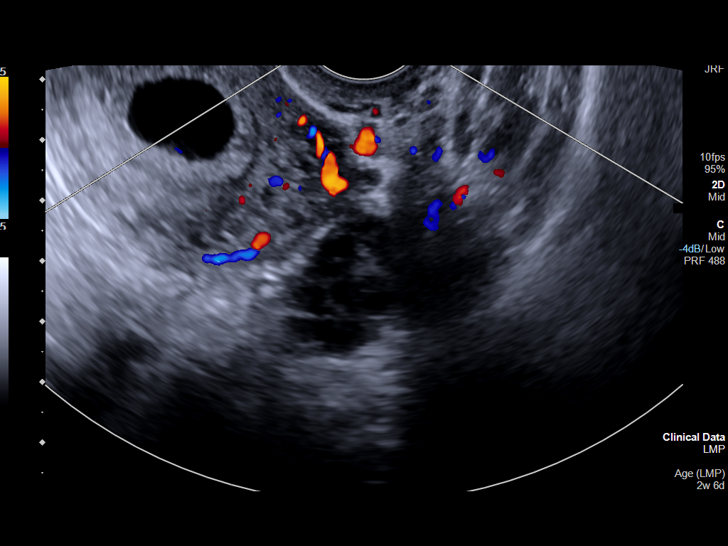
[im 133/133]
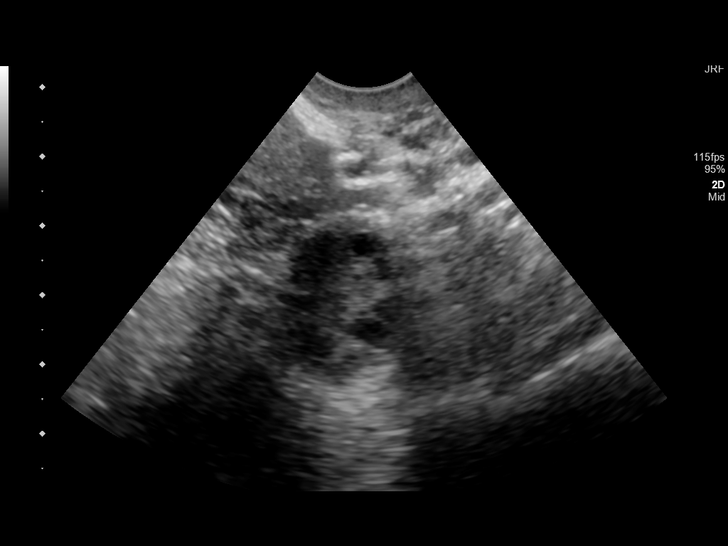

[14 of 28 positions shown; findings below may reference images not displayed]

FINDINGS: Intrauterine gestational sac: None

Yolk sac:  Visualized.

Embryo:  Visualized.

Cardiac Activity: Visualized.

Heart Rate: 114 bpm

CRL:  3.5 mm   6 w   0 d                  US EDC: 11/22/2018

Subchorionic hemorrhage: Small subchorionic hemorrhage is present.
Hemorrhage measures 0.9 x 0.5 x 1.0 centimeters.

Maternal uterus/adnexae: Ovaries are normal in appearance. No free
pelvic fluid.
IMPRESSION: 1. Single living intrauterine embryo.
2. Dating by today's exam differs compared with clinical dating. By
today's exam, EDC is 11/22/2018.
3. Small subchorionic hemorrhage.

## 2019-12-03 ENCOUNTER — Ambulatory Visit (INDEPENDENT_AMBULATORY_CARE_PROVIDER_SITE_OTHER): Payer: Medicaid Other | Admitting: Certified Nurse Midwife

## 2019-12-03 ENCOUNTER — Encounter: Payer: Self-pay | Admitting: Certified Nurse Midwife

## 2019-12-03 ENCOUNTER — Other Ambulatory Visit: Payer: Self-pay

## 2019-12-03 VITALS — BP 96/60 | HR 55 | Ht 64.0 in | Wt 187.1 lb

## 2019-12-03 DIAGNOSIS — Z30431 Encounter for routine checking of intrauterine contraceptive device: Secondary | ICD-10-CM

## 2019-12-03 DIAGNOSIS — B009 Herpesviral infection, unspecified: Secondary | ICD-10-CM | POA: Insufficient documentation

## 2019-12-03 DIAGNOSIS — Z975 Presence of (intrauterine) contraceptive device: Secondary | ICD-10-CM | POA: Insufficient documentation

## 2019-12-03 DIAGNOSIS — M25511 Pain in right shoulder: Secondary | ICD-10-CM | POA: Diagnosis not present

## 2019-12-03 MED ORDER — VALACYCLOVIR HCL 500 MG PO TABS: 500.0000 mg | ORAL_TABLET | Freq: Every day | ORAL | 12 refills | Status: DC

## 2019-12-03 MED ORDER — CYCLOBENZAPRINE HCL 5 MG PO TABS: 5.0000 mg | ORAL_TABLET | Freq: Three times a day (TID) | ORAL | 0 refills | Status: DC | PRN

## 2019-12-03 NOTE — Progress Notes (Signed)
GYN ENCOUNTER NOTE  Subjective:       Felicia Scott is a 24 y.o. [phone] female is here for gynecologic evaluation of the following issues:  1. IUD string check 2. Right shoulder pain for the last week, happened after working out 3. 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    Gynecologic History  Patient's last menstrual period was 11/19/2019 (approximate).  Contraception: IUD, Mirena; inserted 10/15/2019  Last Pap: 03/2019. Results were: normal  Obstetric History  OB History  Gravida Para Term Preterm AB Living  5 1 1   4 1   SAB TAB Ectopic Multiple Live Births  3 1     1     # Outcome Date GA Lbr Len/2nd Weight Sex Delivery Anes PTL Lv  5 Term 11/16/18 [redacted]w[redacted]d  6 lb 9 oz (2.977 kg) M Vag-Spont EPI N LIV     Complications: Oligohydramnios delivered  4 SAB 12/27/17          3 SAB           2 SAB           1 TAB             Past Medical History:  Diagnosis Date  . Asthma   . Depression     Past Surgical History:  Procedure Laterality Date  . APPENDECTOMY      Current Outpatient Medications on File Prior to Visit  Medication Sig Dispense Refill  . levonorgestrel (MIRENA) 20 MCG/24HR IUD 1 each by Intrauterine route once.    . sertraline (ZOLOFT) 50 MG tablet Take 1 tablet (50 mg total) by mouth daily. 30 tablet 6  . Drospirenone (SLYND) 4 MG TABS Take 1 tablet by mouth daily. (Patient not taking: Reported on 12/03/2019) 84 tablet 3   No current facility-administered medications on file prior to visit.    No Known Allergies  Social History   Socioeconomic History  . Marital status: Single    Spouse name: Not on file  . Number of children: Not on file  . Years of education: Not on file  . Highest education level: Not on file  Occupational History  . Not on file  Tobacco Use  . Smoking status: Current Every  Day Smoker    Types: Cigarettes  . Smokeless tobacco: Never Used  Substance and Sexual Activity  . Alcohol use: Yes    Comment: occasional   . Drug use: Not Currently    Types: Marijuana  . Sexual activity: Yes    Birth control/protection: Pill, Condom  Other Topics Concern  . Not on file  Social History Narrative  . Not on file   Social Determinants of Health   Financial Resource Strain:   . Difficulty of Paying Living Expenses:   Food Insecurity:   . Worried About 02/26/18 in the Last Year:   . 02/02/2020 in the Last Year:   Transportation Needs:   . Programme researcher, broadcasting/film/video (Medical):   Barista Lack of Transportation (Non-Medical):   Physical Activity:   . Days of Exercise per Week:   . Minutes of Exercise per Session:   Stress:   . Feeling of Stress :   Social Connections:   . Frequency of Communication with Friends and Family:   . Frequency of Social Gatherings with Friends and Family:   . Attends  Religious Services:   . Active Member of Clubs or Organizations:   . Attends Archivist Meetings:   Marland Kitchen Marital Status:   Intimate Partner Violence:   . Fear of Current or Ex-Partner:   . Emotionally Abused:   Marland Kitchen Physically Abused:   . Sexually Abused:     Family History  Problem Relation Age of Onset  . Healthy Mother   . Healthy Father   . Breast cancer Maternal Grandmother   . Hypertension Neg Hx   . Diabetes Neg Hx   . Ovarian cancer Neg Hx   . Colon cancer Neg Hx     The following portions of the patient's history were reviewed and updated as appropriate: allergies, current medications, past family history, past medical history, past social history, past surgical history and problem list.  Review of Systems  ROS negative except as noted above. Information obtained from patient.   Objective:   BP 96/60   Pulse (!) 55   Ht 5\' 4"  (1.626 m)   Wt 187 lb 2 oz (84.9 kg)   LMP 11/19/2019 (Approximate)   Breastfeeding No   BMI 32.12 kg/m     CONSTITUTIONAL: Well-developed, well-nourished female in no acute distress.   PELVIC:  External Genitalia: Normal  BUS: Normal  Vagina: Normal  Cervix: Normal  Uterus: Normal size, shape,consistency, mobile  Adnexa: Normal  RV: Normal   Bladder: Nontender  MUSCULOSKELETAL: Normal range of motion except for right shoulder. No tenderness.  No cyanosis, clubbing, or edema.  Assessment:   1. IUD check up   2. HSV infection   3. Acute pain of right shoulder   Plan:   Patient to keep IUD in place for up to five (5) years; can come in for removal if she desires pregnancy earlier or for any concerning side effects.  Rx: Flexeril and Valtrex, see orders.   Reviewed red flag symptoms and when to call.   RTC as previously scheduled or sooner if needed.    Diona Fanti, CNM [facility], [facility] 12/03/19 12:45 PM

## 2019-12-03 NOTE — Patient Instructions (Addendum)
Cyclobenzaprine tablets What is this medicine? CYCLOBENZAPRINE (sye kloe BEN za preen) is a muscle relaxer. It is used to treat muscle pain, spasms, and stiffness. This medicine may be used for other purposes; ask your health care provider or pharmacist if you have questions. COMMON BRAND NAME(S): Fexmid, Flexeril What should I tell my health care provider before I take this medicine? They need to know if you have any of these conditions:  heart disease, irregular heartbeat, or previous heart attack  liver disease  thyroid problem  an unusual or allergic reaction to cyclobenzaprine, tricyclic antidepressants, lactose, other medicines, foods, dyes, or preservatives  pregnant or trying to get pregnant  breast-feeding How should I use this medicine? Take this medicine by mouth with a glass of water. Follow the directions on the prescription label. If this medicine upsets your stomach, take it with food or milk. Take your medicine at regular intervals. Do not take it more often than directed. Talk to your pediatrician regarding the use of this medicine in children. Special care may be needed. Overdosage: If you think you have taken too much of this medicine contact a poison control center or emergency room at once. NOTE: This medicine is only for you. Do not share this medicine with others. What if I miss a dose? If you miss a dose, take it as soon as you can. If it is almost time for your next dose, take only that dose. Do not take double or extra doses. What may interact with this medicine? Do not take this medicine with any of the following medications:  MAOIs like Carbex, Eldepryl, Marplan, Nardil, and Parnate  narcotic medicines for cough  safinamide This medicine may also interact with the following medications:  alcohol  bupropion  antihistamines for allergy, cough and cold  certain medicines for anxiety or sleep  certain medicines for bladder problems like oxybutynin,  tolterodine  certain medicines for depression like amitriptyline, fluoxetine, sertraline  certain medicines for Parkinson's disease like benztropine, trihexyphenidyl  certain medicines for seizures like phenobarbital, primidone  certain medicines for stomach problems like dicyclomine, hyoscyamine  certain medicines for travel sickness like scopolamine  general anesthetics like halothane, isoflurane, methoxyflurane, propofol  ipratropium  local anesthetics like lidocaine, pramoxine, tetracaine  medicines that relax muscles for surgery  narcotic medicines for pain  phenothiazines like chlorpromazine, mesoridazine, prochlorperazine, thioridazine  verapamil This list may not describe all possible interactions. Give your health care provider a list of all the medicines, herbs, non-prescription drugs, or dietary supplements you use. Also tell them if you smoke, drink alcohol, or use illegal drugs. Some items may interact with your medicine. What should I watch for while using this medicine? Tell your doctor or health care professional if your symptoms do not start to get better or if they get worse. You may get drowsy or dizzy. Do not drive, use machinery, or do anything that needs mental alertness until you know how this medicine affects you. Do not stand or sit up quickly, especially if you are an older patient. This reduces the risk of dizzy or fainting spells. Alcohol may interfere with the effect of this medicine. Avoid alcoholic drinks. If you are taking another medicine that also causes drowsiness, you may have more side effects. Give your health care provider a list of all medicines you use. Your doctor will tell you how much medicine to take. Do not take more medicine than directed. Call emergency for help if you have problems breathing or unusual sleepiness.  Your mouth may get dry. Chewing sugarless gum or sucking hard candy, and drinking plenty of water may help. Contact your  doctor if the problem does not go away or is severe. What side effects may I notice from receiving this medicine? Side effects that you should report to your doctor or health care professional as soon as possible:  allergic reactions like skin rash, itching or hives, swelling of the face, lips, or tongue  breathing problems  chest pain  fast, irregular heartbeat  hallucinations  seizures  unusually weak or tired Side effects that usually do not require medical attention (report to your doctor or health care professional if they continue or are bothersome):  headache  nausea, vomiting This list may not describe all possible side effects. Call your doctor for medical advice about side effects. You may report side effects to FDA at 1-800-FDA-1088. Where should I keep my medicine? Keep out of the reach of children. Store at room temperature between 15 and 30 degrees C (59 and 86 degrees F). Keep container tightly closed. Throw away any unused medicine after the expiration date. NOTE: This sheet is a summary. It may not cover all possible information. If you have questions about this medicine, talk to your doctor, pharmacist, or health care provider.  2020 Elsevier/Gold Standard (2018-06-17 12:49:26)   Lidocaine dermal patch What is this medicine? LIDOCAINE (LYE doe kane) causes loss of feeling in the skin and surrounding area. The medicine helps treat pain, including nerve pain. This medicine may be used for other purposes; ask your health care provider or pharmacist if you have questions. COMMON BRAND NAME(S): Aspercreme with Lidocaine, Blue-Emu, GEN7T, Lidocare, Lidoderm, Salonpas Lidocaine, ZTlido What should I tell my health care provider before I take this medicine? They need to know if you have any of these conditions:  heart disease  history of irregular heart beat  liver disease  skin conditions or sensitivity  skin infection  an unusual or allergic reaction to  lidocaine, parabens, other medicines, foods, dyes, or preservatives  pregnant or trying to get pregnant  breast-feeding How should I use this medicine? This medicine is for external use only. Follow the directions on the prescription label or package. Talk to your pediatrician regarding the use of this medicine in children. Special care may be needed. Overdosage: If you think you have taken too much of this medicine contact a poison control center or emergency room at once. NOTE: This medicine is only for you. Do not share this medicine with others. What if I miss a dose? If you miss a dose, take it as soon as you can. If it is almost time for your next dose, take only that dose. Do not take double or extra doses. What may interact with this medicine? Do not take this medicine with any of the following medications:  certain medicines for irregular heart beat  MAOIs like Carbex, Eldepryl, Marplan, Nardil, and Parnate This medicine may also interact with the following medications:  other local anesthetics like pramoxine, tetracaine Do not use any other skin products on the affected area without asking your doctor or health care professional. This list may not describe all possible interactions. Give your health care provider a list of all the medicines, herbs, non-prescription drugs, or dietary supplements you use. Also tell them if you smoke, drink alcohol, or use illegal drugs. Some items may interact with your medicine. What should I watch for while using this medicine? Tell your doctor or healthcare professional  if your symptoms do not start to get better or if they get worse. Be careful to avoid injury while the area is numb from the medicine, and you are not aware of pain. If you are going to need surgery, a MRI, CT scan, or other procedure, tell your doctor that you are using this medicine. You may need to remove this patch before the procedure. Do not get this medicine in your eyes.  If you do, rinse out with plenty of cool tap water. This medicine can make certain skin conditions worse. Only use it for conditions for which your doctor or health care professional has prescribed. What side effects may I notice from receiving this medicine? Side effects that you should report to your doctor or health care professional as soon as possible:  allergic reactions like skin rash, itching or hives, swelling of the face, lips, or tongue  breathing problems  chest pain or chest tightness  dizzines Side effects that usually do not require medical attention (report to your doctor or health care professional if they continue or are bothersome):  tingling, numbness at site where applied This list may not describe all possible side effects. Call your doctor for medical advice about side effects. You may report side effects to FDA at 1-800-FDA-1088. Where should I keep my medicine? Keep out of the reach of children. See product for storage instructions. Each product may have different instructions. NOTE: This sheet is a summary. It may not cover all possible information. If you have questions about this medicine, talk to your doctor, pharmacist, or health care provider.  2020 Elsevier/Gold Standard (2017-04-16 22:50:48)   Valacyclovir caplets What is this medicine? VALACYCLOVIR (val ay SYE kloe veer) is an antiviral medicine. It is used to treat or prevent infections caused by certain kinds of viruses. Examples of these infections include herpes and shingles. This medicine will not cure herpes. This medicine may be used for other purposes; ask your health care provider or pharmacist if you have questions. COMMON BRAND NAME(S): Valtrex What should I tell my health care provider before I take this medicine? They need to know if you have any of these conditions:  acquired immunodeficiency syndrome (AIDS)  any other condition that may weaken the immune system  bone marrow or  kidney transplant  kidney disease  an unusual or allergic reaction to valacyclovir, acyclovir, ganciclovir, valganciclovir, other medicines, foods, dyes, or preservatives  pregnant or trying to get pregnant  breast-feeding How should I use this medicine? Take this medicine by mouth with a glass of water. Follow the directions on the prescription label. You can take this medicine with or without food. Take your doses at regular intervals. Do not take your medicine more often than directed. Finish the full course prescribed by your doctor or health care professional even if you think your condition is better. Do not stop taking except on the advice of your doctor or health care professional. Talk to your pediatrician regarding the use of this medicine in children. While this drug may be prescribed for children as young as 2 years for selected conditions, precautions do apply. Overdosage: If you think you have taken too much of this medicine contact a poison control center or emergency room at once. NOTE: This medicine is only for you. Do not share this medicine with others. What if I miss a dose? If you miss a dose, take it as soon as you can. If it is almost time for your next dose,  take only that dose. Do not take double or extra doses. What may interact with this medicine? Do not take this medicine with any of the following medications:  cidofovir This medicine may also interact with the following medications:  adefovir  amphotericin B  certain antibiotics like amikacin, gentamicin, tobramycin, vancomycin  cimetidine  cisplatin  colistin  cyclosporine  foscarnet  lithium  methotrexate  probenecid  tacrolimus This list may not describe all possible interactions. Give your health care provider a list of all the medicines, herbs, non-prescription drugs, or dietary supplements you use. Also tell them if you smoke, drink alcohol, or use illegal drugs. Some items may interact  with your medicine. What should I watch for while using this medicine? Tell your doctor or health care professional if your symptoms do not start to get better after 1 week. This medicine works best when taken early in the course of an infection, within the first 72 hours. Begin treatment as soon as possible after the first signs of infection like tingling, itching, or pain in the affected area. It is possible that genital herpes may still be spread even when you are not having symptoms. Always use safer sex practices like condoms made of latex or polyurethane whenever you have sexual contact. You should stay well hydrated while taking this medicine. Drink plenty of fluids. What side effects may I notice from receiving this medicine? Side effects that you should report to your doctor or health care professional as soon as possible:  allergic reactions like skin rash, itching or hives, swelling of the face, lips, or tongue  aggressive behavior  confusion  hallucinations  problems with balance, talking, walking  stomach pain  tremor  trouble passing urine or change in the amount of urine Side effects that usually do not require medical attention (report to your doctor or health care professional if they continue or are bothersome):  dizziness  headache  nausea, vomiting This list may not describe all possible side effects. Call your doctor for medical advice about side effects. You may report side effects to FDA at 1-800-FDA-1088. Where should I keep my medicine? Keep out of the reach of children. Store at room temperature between 15 and 25 degrees C (59 and 77 degrees F). Keep container tightly closed. Throw away any unused medicine after the expiration date. NOTE: This sheet is a summary. It may not cover all possible information. If you have questions about this medicine, talk to your doctor, pharmacist, or health care provider.  2020 Elsevier/Gold Standard (2018-08-11  12:22:33)   Genital Herpes Genital herpes is a common sexually transmitted infection (STI) that is caused by a virus. The virus spreads from person to person through sexual contact. Infection can cause itching, blisters, and sores around the genitals or rectum. Symptoms may last several days and then go away This is called an outbreak. However, the virus remains in your body, so you may have more outbreaks in the future. The time between outbreaks varies and can be months or years. Genital herpes affects men and women. It is particularly concerning for pregnant women because the virus can be passed to the baby during delivery and can cause serious problems. Genital herpes is also a concern for people who have a weak disease-fighting (immune) system. What are the causes? This condition is caused by the herpes simplex virus (HSV) type 1 or type 2. The virus may spread through:  Sexual contact with an infected person, including vaginal, anal, and oral sex.  Contact with fluid from a herpes sore.  The skin. This means that you can get herpes from an infected partner even if he or she does not have a visible sore or does not know that he or she is infected. What increases the risk? You are more likely to develop this condition if:  You have sex with many partners.  You do not use latex condoms during sex. What are the signs or symptoms? Most people do not have symptoms (asymptomatic) or have mild symptoms that may be mistaken for other skin problems. Symptoms may include:  Small red bumps near the genitals, rectum, or mouth. These bumps turn into blisters and then turn into sores.  Flu-like symptoms, including: ? Fever. ? Body aches. ? Swollen lymph nodes. ? Headache.  Painful urination.  Pain and itching in the genital area or rectal area.  Vaginal discharge.  Tingling or shooting pain in the legs and buttocks. Generally, symptoms are more severe and last longer during the first  (primary) outbreak. Flu-like symptoms are also more common during the primary outbreak. How is this diagnosed? Genital herpes may be diagnosed based on:  A physical exam.  Your medical history.  Blood tests.  A test of a fluid sample (culture) from an open sore. How is this treated? There is no cure for this condition, but treatment with antiviral medicines that are taken by mouth (orally) can do the following:  Speed up healing and relieve symptoms.  Help to reduce the spread of the virus to sexual partners.  Limit the chance of future outbreaks, or make future outbreaks shorter.  Lessen symptoms of future outbreaks. Your health care provider may also recommend pain relief medicines, such as aspirin or ibuprofen. Follow these instructions at home: Sexual activity  Do not have sexual contact during active outbreaks.  Practice safe sex. Latex condoms and female condoms may help prevent the spread of the herpes virus. General instructions  Keep the affected areas dry and clean.  Take over-the-counter and prescription medicines only as told by your health care provider.  Avoid rubbing or touching blisters and sores. If you do touch blisters or sores: ? Wash your hands thoroughly with soap and water. ? Do not touch your eyes afterward.  To help relieve pain or itching, you may take the following actions as directed by your health care provider: ? Apply a cold, wet cloth (cold compress) to affected areas 4-6 times a day. ? Apply a substance that protects your skin and reduces bleeding (astringent). ? Apply a gel that helps relieve pain around sores (lidocaine gel). ? Take a warm, shallow bath that cleans the genital area (sitz bath).  Keep all follow-up visits as told by your health care provider. This is important. How is this prevented?  Use condoms. Although anyone can get genital herpes during sexual contact, even with the use of a condom, a condom can provide some  protection.  Avoid having multiple sexual partners.  Talk with your sexual partner about any symptoms either of you may have. Also, talk with your partner about any history of STIs.  Get tested for STIs before you have sex. Ask your partner to do the same.  Do not have sexual contact if you have symptoms of genital herpes. Contact a health care provider if:  Your symptoms are not improving with medicine.  Your symptoms return.  You have new symptoms.  You have a fever.  You have abdominal pain.  You have redness, swelling,  or pain in your eye.  You notice new sores on other parts of your body.  You are a woman and experience bleeding between menstrual periods.  You have had herpes and you become pregnant or plan to become pregnant. Summary  Genital herpes is a common sexually transmitted infection (STI) that is caused by the herpes simplex virus (HSV) type 1 or type 2.  These viruses are most often spread through sexual contact with an infected person.  You are more likely to develop this condition if you have sex with many partners or you have unprotected sex.  Most people do not have symptoms (asymptomatic) or have mild symptoms that may be mistaken for other skin problems. Symptoms occur as outbreaks that may happen months or years apart.  There is no cure for this condition, but treatment with oral antiviral medicines can reduce symptoms, reduce the chance of spreading the virus to a partner, prevent future outbreaks, or shorten future outbreaks. This information is not intended to replace advice given to you by your health care provider. Make sure you discuss any questions you have with your health care provider. Document Revised: 01/19/2018 Document Reviewed: 06/14/2016 Elsevier Patient Education  2020 [employer].    Levonorgestrel intrauterine device (IUD) What is this medicine? LEVONORGESTREL IUD (LEE voe nor jes trel) is a contraceptive (birth control)  device. The device is placed inside the uterus by a healthcare professional. It is used to prevent pregnancy. This device can also be used to treat heavy bleeding that occurs during your period. This medicine may be used for other purposes; ask your health care provider or pharmacist if you have questions. COMMON BRAND NAME(S): Cameron Ali What should I tell my health care provider before I take this medicine? They need to know if you have any of these conditions:  abnormal Pap smear  cancer of the breast, uterus, or cervix  diabetes  endometritis  genital or pelvic infection now or in the past  have more than one sexual partner or your partner has more than one partner  heart disease  history of an ectopic or tubal pregnancy  immune system problems  IUD in place  liver disease or tumor  problems with blood clots or take blood-thinners  seizures  use intravenous drugs  uterus of unusual shape  vaginal bleeding that has not been explained  an unusual or allergic reaction to levonorgestrel, other hormones, silicone, or polyethylene, medicines, foods, dyes, or preservatives  pregnant or trying to get pregnant  breast-feeding How should I use this medicine? This device is placed inside the uterus by a health care professional. Talk to your pediatrician regarding the use of this medicine in children. Special care may be needed. Overdosage: If you think you have taken too much of this medicine contact a poison control center or emergency room at once. NOTE: This medicine is only for you. Do not share this medicine with others. What if I miss a dose? This does not apply. Depending on the brand of device you have inserted, the device will need to be replaced every 3 to 6 years if you wish to continue using this type of birth control. What may interact with this medicine? Do not take this medicine with any of the following  medications:  amprenavir  bosentan  fosamprenavir This medicine may also interact with the following medications:  aprepitant  armodafinil  barbiturate medicines for inducing sleep or treating seizures  bexarotene  boceprevir  griseofulvin  medicines to treat seizures like carbamazepine, ethotoin, felbamate, oxcarbazepine, phenytoin, topiramate  modafinil  pioglitazone  rifabutin  rifampin  rifapentine  some medicines to treat HIV infection like atazanavir, efavirenz, indinavir, lopinavir, nelfinavir, tipranavir, ritonavir  St. John's wort  warfarin This list may not describe all possible interactions. Give your health care provider a list of all the medicines, herbs, non-prescription drugs, or dietary supplements you use. Also tell them if you smoke, drink alcohol, or use illegal drugs. Some items may interact with your medicine. What should I watch for while using this medicine? Visit your doctor or health care professional for regular check ups. See your doctor if you or your partner has sexual contact with others, becomes HIV positive, or gets a sexual transmitted disease. This product does not protect you against HIV infection (AIDS) or other sexually transmitted diseases. You can check the placement of the IUD yourself by reaching up to the top of your vagina with clean fingers to feel the threads. Do not pull on the threads. It is a good habit to check placement after each menstrual period. Call your doctor right away if you feel more of the IUD than just the threads or if you cannot feel the threads at all. The IUD may come out by itself. You may become pregnant if the device comes out. If you notice that the IUD has come out use a backup birth control method like condoms and call your health care provider. Using tampons will not change the position of the IUD and are okay to use during your period. This IUD can be safely scanned with magnetic resonance imaging  (MRI) only under specific conditions. Before you have an MRI, tell your healthcare provider that you have an IUD in place, and which type of IUD you have in place. What side effects may I notice from receiving this medicine? Side effects that you should report to your doctor or health care professional as soon as possible:  allergic reactions like skin rash, itching or hives, swelling of the face, lips, or tongue  fever, flu-like symptoms  genital sores  high blood pressure  no menstrual period for 6 weeks during use  pain, swelling, warmth in the leg  pelvic pain or tenderness  severe or sudden headache  signs of pregnancy  stomach cramping  sudden shortness of breath  trouble with balance, talking, or walking  unusual vaginal bleeding, discharge  yellowing of the eyes or skin Side effects that usually do not require medical attention (report to your doctor or health care professional if they continue or are bothersome):  acne  breast pain  change in sex drive or performance  changes in weight  cramping, dizziness, or faintness while the device is being inserted  headache  irregular menstrual bleeding within first 3 to 6 months of use  nausea This list may not describe all possible side effects. Call your doctor for medical advice about side effects. You may report side effects to FDA at 1-800-FDA-1088. Where should I keep my medicine? This does not apply. NOTE: This sheet is a summary. It may not cover all possible information. If you have questions about this medicine, talk to your doctor, pharmacist, or health care provider.  2020 Elsevier/Gold Standard (2018-05-26 13:22:01)

## 2019-12-17 DIAGNOSIS — R21 Rash and other nonspecific skin eruption: Secondary | ICD-10-CM | POA: Diagnosis not present

## 2020-01-27 DIAGNOSIS — Z419 Encounter for procedure for purposes other than remedying health state, unspecified: Secondary | ICD-10-CM | POA: Diagnosis not present

## 2020-02-27 DIAGNOSIS — Z419 Encounter for procedure for purposes other than remedying health state, unspecified: Secondary | ICD-10-CM | POA: Diagnosis not present

## 2020-03-29 DIAGNOSIS — Z419 Encounter for procedure for purposes other than remedying health state, unspecified: Secondary | ICD-10-CM | POA: Diagnosis not present

## 2020-04-06 ENCOUNTER — Other Ambulatory Visit: Payer: Self-pay

## 2020-04-06 ENCOUNTER — Other Ambulatory Visit (HOSPITAL_COMMUNITY)
Admission: RE | Admit: 2020-04-06 | Discharge: 2020-04-06 | Disposition: A | Discharge status: Home / Self Care | Payer: Medicaid Other | Source: Ambulatory Visit | Attending: Certified Nurse Midwife | Admitting: Certified Nurse Midwife

## 2020-04-06 ENCOUNTER — Ambulatory Visit (INDEPENDENT_AMBULATORY_CARE_PROVIDER_SITE_OTHER): Payer: Medicaid Other | Admitting: Certified Nurse Midwife

## 2020-04-06 ENCOUNTER — Encounter: Payer: Self-pay | Admitting: Certified Nurse Midwife

## 2020-04-06 VITALS — BP 106/62 | HR 70 | Wt 181.6 lb

## 2020-04-06 DIAGNOSIS — Z01419 Encounter for gynecological examination (general) (routine) without abnormal findings: Secondary | ICD-10-CM | POA: Diagnosis not present

## 2020-04-06 DIAGNOSIS — Z113 Encounter for screening for infections with a predominantly sexual mode of transmission: Secondary | ICD-10-CM

## 2020-04-06 DIAGNOSIS — Z975 Presence of (intrauterine) contraceptive device: Secondary | ICD-10-CM

## 2020-04-06 NOTE — Patient Instructions (Signed)
Levonorgestrel intrauterine device (IUD) What is this medicine? LEVONORGESTREL IUD (LEE voe nor jes trel) is a contraceptive (birth control) device. The device is placed inside the uterus by a healthcare professional. It is used to prevent pregnancy. This device can also be used to treat heavy bleeding that occurs during your period. This medicine may be used for other purposes; ask your health care provider or pharmacist if you have questions. COMMON BRAND NAME(S): Minette Headland What should I tell my health care provider before I take this medicine? They need to know if you have any of these conditions:  abnormal Pap smear  cancer of the breast, uterus, or cervix  diabetes  endometritis  genital or pelvic infection now or in the past  have more than one sexual partner or your partner has more than one partner  heart disease  history of an ectopic or tubal pregnancy  immune system problems  IUD in place  liver disease or tumor  problems with blood clots or take blood-thinners  seizures  use intravenous drugs  uterus of unusual shape  vaginal bleeding that has not been explained  an unusual or allergic reaction to levonorgestrel, other hormones, silicone, or polyethylene, medicines, foods, dyes, or preservatives  pregnant or trying to get pregnant  breast-feeding How should I use this medicine? This device is placed inside the uterus by a health care professional. Talk to your pediatrician regarding the use of this medicine in children. Special care may be needed. Overdosage: If you think you have taken too much of this medicine contact a poison control center or emergency room at once. NOTE: This medicine is only for you. Do not share this medicine with others. What if I miss a dose? This does not apply. Depending on the brand of device you have inserted, the device will need to be replaced every 3 to 6 years if you wish to continue using this type  of birth control. What may interact with this medicine? Do not take this medicine with any of the following medications:  amprenavir  bosentan  fosamprenavir This medicine may also interact with the following medications:  aprepitant  armodafinil  barbiturate medicines for inducing sleep or treating seizures  bexarotene  boceprevir  griseofulvin  medicines to treat seizures like carbamazepine, ethotoin, felbamate, oxcarbazepine, phenytoin, topiramate  modafinil  pioglitazone  rifabutin  rifampin  rifapentine  some medicines to treat HIV infection like atazanavir, efavirenz, indinavir, lopinavir, nelfinavir, tipranavir, ritonavir  St. John's wort  warfarin This list may not describe all possible interactions. Give your health care provider a list of all the medicines, herbs, non-prescription drugs, or dietary supplements you use. Also tell them if you smoke, drink alcohol, or use illegal drugs. Some items may interact with your medicine. What should I watch for while using this medicine? Visit your doctor or health care professional for regular check ups. See your doctor if you or your partner has sexual contact with others, becomes HIV positive, or gets a sexual transmitted disease. This product does not protect you against HIV infection (AIDS) or other sexually transmitted diseases. You can check the placement of the IUD yourself by reaching up to the top of your vagina with clean fingers to feel the threads. Do not pull on the threads. It is a good habit to check placement after each menstrual period. Call your doctor right away if you feel more of the IUD than just the threads or if you cannot feel the threads at  all. The IUD may come out by itself. You may become pregnant if the device comes out. If you notice that the IUD has come out use a backup birth control method like condoms and call your health care provider. Using tampons will not change the position of the  IUD and are okay to use during your period. This IUD can be safely scanned with magnetic resonance imaging (MRI) only under specific conditions. Before you have an MRI, tell your healthcare provider that you have an IUD in place, and which type of IUD you have in place. What side effects may I notice from receiving this medicine? Side effects that you should report to your doctor or health care professional as soon as possible:  allergic reactions like skin rash, itching or hives, swelling of the face, lips, or tongue  fever, flu-like symptoms  genital sores  high blood pressure  no menstrual period for 6 weeks during use  pain, swelling, warmth in the leg  pelvic pain or tenderness  severe or sudden headache  signs of pregnancy  stomach cramping  sudden shortness of breath  trouble with balance, talking, or walking  unusual vaginal bleeding, discharge  yellowing of the eyes or skin Side effects that usually do not require medical attention (report to your doctor or health care professional if they continue or are bothersome):  acne  breast pain  change in sex drive or performance  changes in weight  cramping, dizziness, or faintness while the device is being inserted  headache  irregular menstrual bleeding within first 3 to 6 months of use  nausea This list may not describe all possible side effects. Call your doctor for medical advice about side effects. You may report side effects to FDA at 1-800-FDA-1088. Where should I keep my medicine? This does not apply. NOTE: This sheet is a summary. It may not cover all possible information. If you have questions about this medicine, talk to your doctor, pharmacist, or health care provider.  2020 Elsevier/Gold Standard (2018-05-26 13:22:01)   Preventive Care 21-39 Years Old, Female Preventive care refers to visits with your health care provider and lifestyle choices that can promote health and wellness. This  includes:  A yearly physical exam. This may also be called an annual well check.  Regular dental visits and eye exams.  Immunizations.  Screening for certain conditions.  Healthy lifestyle choices, such as eating a healthy diet, getting regular exercise, not using drugs or products that contain nicotine and tobacco, and limiting alcohol use. What can I expect for my preventive care visit? Physical exam Your health care provider will check your:  Height and weight. This may be used to calculate body mass index (BMI), which tells if you are at a healthy weight.  Heart rate and blood pressure.  Skin for abnormal spots. Counseling Your health care provider may ask you questions about your:  Alcohol, tobacco, and drug use.  Emotional well-being.  Home and relationship well-being.  Sexual activity.  Eating habits.  Work and work environment.  Method of birth control.  Menstrual cycle.  Pregnancy history. What immunizations do I need?  Influenza (flu) vaccine  This is recommended every year. Tetanus, diphtheria, and pertussis (Tdap) vaccine  You may need a Td booster every 10 years. Varicella (chickenpox) vaccine  You may need this if you have not been vaccinated. Human papillomavirus (HPV) vaccine  If recommended by your health care provider, you may need three doses over 6 months. Measles, mumps, and   rubella (MMR) vaccine  You may need at least one dose of MMR. You may also need a second dose. Meningococcal conjugate (MenACWY) vaccine  One dose is recommended if you are age 19-21 years and a first-year college student living in a residence hall, or if you have one of several medical conditions. You may also need additional booster doses. Pneumococcal conjugate (PCV13) vaccine  You may need this if you have certain conditions and were not previously vaccinated. Pneumococcal polysaccharide (PPSV23) vaccine  You may need one or two doses if you smoke  cigarettes or if you have certain conditions. Hepatitis A vaccine  You may need this if you have certain conditions or if you travel or work in places where you may be exposed to hepatitis A. Hepatitis B vaccine  You may need this if you have certain conditions or if you travel or work in places where you may be exposed to hepatitis B. Haemophilus influenzae type b (Hib) vaccine  You may need this if you have certain conditions. You may receive vaccines as individual doses or as more than one vaccine together in one shot (combination vaccines). Talk with your health care provider about the risks and benefits of combination vaccines. What tests do I need?  Blood tests  Lipid and cholesterol levels. These may be checked every 5 years starting at age 20.  Hepatitis C test.  Hepatitis B test. Screening  Diabetes screening. This is done by checking your blood sugar (glucose) after you have not eaten for a while (fasting).  Sexually transmitted disease (STD) testing.  BRCA-related cancer screening. This may be done if you have a family history of breast, ovarian, tubal, or peritoneal cancers.  Pelvic exam and Pap test. This may be done every 3 years starting at age 21. Starting at age 30, this may be done every 5 years if you have a Pap test in combination with an HPV test. Talk with your health care provider about your test results, treatment options, and if necessary, the need for more tests. Follow these instructions at home: Eating and drinking   Eat a diet that includes fresh fruits and vegetables, whole grains, lean protein, and low-fat dairy.  Take vitamin and mineral supplements as recommended by your health care provider.  Do not drink alcohol if: ? Your health care provider tells you not to drink. ? You are pregnant, may be pregnant, or are planning to become pregnant.  If you drink alcohol: ? Limit how much you have to 0-1 drink a day. ? Be aware of how much alcohol  is in your drink. In the [place] one drink equals one 12 oz bottle of beer (355 mL), one 5 oz glass of wine (148 mL), or one 1 oz glass of hard liquor (44 mL). Lifestyle  Take daily care of your teeth and gums.  Stay active. Exercise for at least 30 minutes on 5 or more days each week.  Do not use any products that contain nicotine or tobacco, such as cigarettes, e-cigarettes, and chewing tobacco. If you need help quitting, ask your health care provider.  If you are sexually active, practice safe sex. Use a condom or other form of birth control (contraception) in order to prevent pregnancy and STIs (sexually transmitted infections). If you plan to become pregnant, see your health care provider for a preconception visit. What's next?  Visit your health care provider once a year for a well check visit.  Ask your health care provider how   how often you should have your eyes and teeth checked.  Stay up to date on all vaccines. This information is not intended to replace advice given to you by your health care provider. Make sure you discuss any questions you have with your health care provider. Document Revised: 03/26/2018 Document Reviewed: 03/26/2018 Elsevier Patient Education  2020 [employer].

## 2020-04-06 NOTE — Progress Notes (Signed)
ANNUAL PREVENTATIVE CARE GYN  ENCOUNTER NOTE  Subjective:       Felicia Scott is a 24 y.o. [phone] female here for a routine annual gynecologic exam.  Current complaints: 1. Requests STI testing  Doing well mood wise except for increased paranoia regarding establishing new romantic relationship while co-parenting with her ex-boyfriend.    Working a part Restaurant manager, fast food job. Grandfather or stepdad watch her son during working hours.   Denies difficulty breathing or respiratory distress, chest pain, abdominal pain, excessive vaginal bleeding, dysuria, and leg pain or swelling.    Gynecologic History  No LMP recorded. (Menstrual status: IUD).  Contraception: IUD, Mirena  Last Pap: 03/2019. Results were: normal  Obstetric History  OB History  Gravida Para Term Preterm AB Living  5 1 1   4 1   SAB TAB Ectopic Multiple Live Births  3 1     1     # Outcome Date GA Lbr Len/2nd Weight Sex Delivery Anes PTL Lv  5 Term 11/16/18 [redacted]w[redacted]d  6 lb 9 oz (2.977 kg) M Vag-Spont EPI N LIV     Complications: Oligohydramnios delivered  4 SAB 12/27/17          3 SAB           2 SAB           1 TAB             Past Medical History:  Diagnosis Date  . Asthma   . Depression     Past Surgical History:  Procedure Laterality Date  . APPENDECTOMY      Current Outpatient Medications on File Prior to Visit  Medication Sig Dispense Refill  . cyclobenzaprine (FLEXERIL) 5 MG tablet Take 1 tablet (5 mg total) by mouth 3 (three) times Felicia as needed for muscle spasms. 15 tablet 0  . levonorgestrel (MIRENA) 20 MCG/24HR IUD 1 each by Intrauterine route once.    . sertraline (ZOLOFT) 50 MG tablet Take 1 tablet (50 mg total) by mouth Felicia. 30 tablet 6  . valACYclovir (VALTREX) 500 MG tablet Take 1 tablet (500 mg total) by mouth Felicia. Can increase to twice a day for 5 days in the event of a recurrence 30 tablet 12  . Drospirenone (SLYND) 4 MG TABS Take 1 tablet by mouth Felicia. (Patient not taking: Reported  on 12/03/2019) 84 tablet 3  . predniSONE (DELTASONE) 20 MG tablet Take by mouth.     No current facility-administered medications on file prior to visit.    No Known Allergies  Social History   Socioeconomic History  . Marital status: Single    Spouse name: Not on file  . Number of children: Not on file  . Years of education: Not on file  . Highest education level: Not on file  Occupational History  . Not on file  Tobacco Use  . Smoking status: Current Every Day Smoker    Types: Cigarettes  . Smokeless tobacco: Never Used  Vaping Use  . Vaping Use: Every day  . Substances: Nicotine, Flavoring  Substance and Sexual Activity  . Alcohol use: Yes    Comment: occasional   . Drug use: Not Currently    Types: Marijuana  . Sexual activity: Yes    Birth control/protection: I.U.D.  Other Topics Concern  . Not on file  Social History Narrative  . Not on file   Social Determinants of Health   Financial Resource Strain:   . Difficulty of Paying Living Expenses:  Not on file  Food Insecurity:   . Worried About Programme researcher, broadcasting/film/video in the Last Year: Not on file  . Ran Out of Food in the Last Year: Not on file  Transportation Needs:   . Lack of Transportation (Medical): Not on file  . Lack of Transportation (Non-Medical): Not on file  Physical Activity:   . Days of Exercise per Week: Not on file  . Minutes of Exercise per Session: Not on file  Stress:   . Feeling of Stress : Not on file  Social Connections:   . Frequency of Communication with Friends and Family: Not on file  . Frequency of Social Gatherings with Friends and Family: Not on file  . Attends Religious Services: Not on file  . Active Member of Clubs or Organizations: Not on file  . Attends Banker Meetings: Not on file  . Marital Status: Not on file  Intimate Partner Violence:   . Fear of Current or Ex-Partner: Not on file  . Emotionally Abused: Not on file  . Physically Abused: Not on file  .  Sexually Abused: Not on file    Family History  Problem Relation Age of Onset  . Healthy Mother   . Healthy Father   . Breast cancer Maternal Grandmother   . Diabetes Maternal Grandmother   . Hypertension Neg Hx   . Ovarian cancer Neg Hx   . Colon cancer Neg Hx     The following portions of the patient's history were reviewed and updated as appropriate: allergies, current medications, past family history, past medical history, past social history, past surgical history and problem list.  Review of Systems  ROS negative except as noted above. Information obtained from patient.    Objective:   BP 106/62   Pulse 70   Wt 181 lb 9.6 oz (82.4 kg)   Breastfeeding No   BMI 31.17 kg/m   CONSTITUTIONAL: Well-developed, well-nourished female in no acute distress.   PSYCHIATRIC: Normal mood and affect. Normal behavior. Normal judgment and thought content.  NEUROLGIC: Alert and oriented to person, place, and time. Normal muscle tone coordination. No cranial nerve deficit noted.  HENT:  Normocephalic, atraumatic, External right and left ear normal.   EYES: Conjunctivae and EOM are normal. Pupils are equal and round.   NECK: Normal range of motion, supple, no masses.  Normal thyroid.   SKIN: Skin is warm and dry. No rash noted. Not diaphoretic. No erythema. No pallor.  CARDIOVASCULAR: Normal heart rate noted, regular rhythm, no murmur.  RESPIRATORY: Clear to auscultation bilaterally. Effort and breath sounds normal, no problems with respiration noted.  BREASTS: Symmetric in size. No masses, skin changes, nipple drainage, or lymphadenopathy.  ABDOMEN: Soft, normal bowel sounds, no distention noted.  No tenderness, rebound or guarding.   PELVIC:  External Genitalia: Normal  Vagina: Normal  Cervix: Normal, IUD strings, swab collected  Uterus: Normal  Adnexa: Normal  MUSCULOSKELETAL: Normal range of motion. No tenderness.  No cyanosis, clubbing, or edema.  2+ distal  pulses.  LYMPHATIC: No Axillary, Supraclavicular, or Inguinal Adenopathy.  Assessment:   Annual gynecologic examination 24 y.o.   Contraception: IUD, Mirena   Obesity 1   Problem List Items Addressed This Visit      Other   IUD (intrauterine device) in place   Relevant Orders   Cervicovaginal ancillary only    Other Visit Diagnoses    Well woman exam    -  Primary   Relevant Orders  Cervicovaginal ancillary only   Routine screening for STI (sexually transmitted infection)       Relevant Orders   Cervicovaginal ancillary only      Plan:   Pap: GC/CT NAAT  Labs: See orders  Routine preventative health maintenance measures emphasized: Exercise/Diet/Weight control, Tobacco Warnings, Alcohol/Substance use risks, Stress Management, Peer Pressure Issues and Safe Sex; see AVS  Reviewed red flag symptoms and when to call  Return to Clinic - 1 Year for [facility] or sooner if needed   Serafina Royals, CNM  [facility], [facility] 04/06/20 11:01 AM

## 2020-04-10 LAB — CERVICOVAGINAL ANCILLARY ONLY
Bacterial Vaginitis (gardnerella): NEGATIVE
Candida Glabrata: NEGATIVE
Candida Vaginitis: NEGATIVE
Chlamydia: NEGATIVE
Comment: NEGATIVE
Comment: NEGATIVE
Comment: NEGATIVE
Comment: NEGATIVE
Comment: NEGATIVE
Comment: NORMAL
Neisseria Gonorrhea: NEGATIVE
Trichomonas: NEGATIVE

## 2020-04-28 DIAGNOSIS — Z419 Encounter for procedure for purposes other than remedying health state, unspecified: Secondary | ICD-10-CM | POA: Diagnosis not present

## 2020-05-29 DIAGNOSIS — Z419 Encounter for procedure for purposes other than remedying health state, unspecified: Secondary | ICD-10-CM | POA: Diagnosis not present

## 2020-06-02 ENCOUNTER — Encounter: Payer: Self-pay | Admitting: Certified Nurse Midwife

## 2020-06-02 ENCOUNTER — Ambulatory Visit (INDEPENDENT_AMBULATORY_CARE_PROVIDER_SITE_OTHER): Payer: Medicaid Other | Admitting: Certified Nurse Midwife

## 2020-06-02 ENCOUNTER — Other Ambulatory Visit: Payer: Self-pay

## 2020-06-02 VITALS — BP 108/78 | Ht 64.0 in | Wt 180.2 lb

## 2020-06-02 DIAGNOSIS — N764 Abscess of vulva: Secondary | ICD-10-CM

## 2020-06-02 MED ORDER — CEFDINIR 300 MG PO CAPS: 300.0000 mg | ORAL_CAPSULE | Freq: Two times a day (BID) | ORAL | 0 refills | Status: AC

## 2020-06-02 NOTE — Patient Instructions (Addendum)
Cefdinir Capsules What is this medicine? CEFDINIR (SEF di ner) is a cephalosporin antibiotic. It treats some infections caused by bacteria. It will not work for colds, the flu, or other viruses. This medicine may be used for other purposes; ask your health care provider or pharmacist if you have questions. COMMON BRAND NAME(S): Omnicef What should I tell my health care provider before I take this medicine? They need to know if you have any of these conditions:  bleeding problems  kidney disease  stomach or intestine problems (especially colitis)  an unusual or allergic reaction to cefdinir, other cephalosporin antibiotics, penicillin, penicillamine, other foods, dyes or preservatives  pregnant or trying to get pregnant  breast-feeding How should I use this medicine? Take this drug by mouth. Take it as directed on the prescription label at the same time every day. You can take it with or without food. If it upsets your stomach, take it with food. Take all of this drug unless your health care provider tells you to stop it early. Keep taking it even if you think you are better. Take products with aluminum, magnesium, or iron in them at a different time of day than this drug. Take this drug 2 hours BEFORE or 2 hours AFTER these products. Talk to your health care provider about the use of this drug in children. While it may be prescribed for selected conditions, precautions do apply. Overdosage: If you think you have taken too much of this medicine contact a poison control center or emergency room at once. NOTE: This medicine is only for you. Do not share this medicine with others. What if I miss a dose? If you miss a dose, take it as soon as you can. If it is almost time for your next dose, take only that dose. Do not take double or extra doses. What may interact with this medicine?  antacids that contain aluminum or magnesium  iron supplements  other antibiotics  probenecid This list  may not describe all possible interactions. Give your health care provider a list of all the medicines, herbs, non-prescription drugs, or dietary supplements you use. Also tell them if you smoke, drink alcohol, or use illegal drugs. Some items may interact with your medicine. What should I watch for while using this medicine? Tell your doctor or health care provider if your symptoms do not get better in a few days. This medicine may cause serious skin reactions. They can happen weeks to months after starting the medicine. Contact your health care provider right away if you notice fevers or flu-like symptoms with a rash. The rash may be red or purple and then turn into blisters or peeling of the skin. Or, you might notice a red rash with swelling of the face, lips or lymph nodes in your neck or under your arms. If you are diabetic you may get a false-positive result for sugar in your urine. Check with your doctor or health care provider before you change your diet or the dose of your diabetes medicine. What side effects may I notice from receiving this medicine? Side effects that you should report to your doctor or health care professional as soon as possible:  allergic reactions like skin rash, itching or hives, swelling of the face, lips, or tongue  bloody or watery diarrhea  breathing problems  fever  redness, blistering, peeling or loosening of the skin, including inside the mouth  seizures  trouble passing urine or change in the amount of urine  unusual bleeding or bruising  unusually weak or tired Side effects that usually do not require medical attention (report to your doctor or health care professional if they continue or are bothersome):  constipation  diarrhea  dizziness  dry mouth  headache  loss of appetite  nausea, vomiting  stomach pain  stool discoloration  tiredness  vaginal discharge, itching, or odor in women This list may not describe all possible  side effects. Call your doctor for medical advice about side effects. You may report side effects to FDA at 1-800-FDA-1088. Where should I keep my medicine? Keep out of the reach of children and pets. Store at room temperature between 20 and 25 degrees C (68 and 77 degrees F). Throw away any unused drug after the expiration date. NOTE: This sheet is a summary. It may not cover all possible information. If you have questions about this medicine, talk to your doctor, pharmacist, or health care provider.  2020 Elsevier/Gold Standard (2019-02-17 15:57:02)   Skin Abscess  A skin abscess is an infected area on or under your skin that contains a collection of pus and other material. An abscess may also be called a furuncle, carbuncle, or boil. An abscess can occur in or on almost any part of your body. Some abscesses break open (rupture) on their own. Most continue to get worse unless they are treated. The infection can spread deeper into the body and eventually into your blood, which can make you feel ill. Treatment usually involves draining the abscess. What are the causes? An abscess occurs when germs, like bacteria, pass through your skin and cause an infection. This may be caused by:  A scrape or cut on your skin.  A puncture wound through your skin, including a needle injection or insect bite.  Blocked oil or sweat glands.  Blocked and infected hair follicles.  A cyst that forms beneath your skin (sebaceous cyst) and becomes infected. What increases the risk? This condition is more likely to develop in people who:  Have a weak body defense system (immune system).  Have diabetes.  Have dry and irritated skin.  Get frequent injections or use illegal IV drugs.  Have a foreign body in a wound, such as a splinter.  Have problems with their lymph system or veins. What are the signs or symptoms? Symptoms of this condition include:  A painful, firm bump under the skin.  A bump with  pus at the top. This may break through the skin and drain. Other symptoms include:  Redness surrounding the abscess site.  Warmth.  Swelling of the lymph nodes (glands) near the abscess.  Tenderness.  A sore on the skin. How is this diagnosed? This condition may be diagnosed based on:  A physical exam.  Your medical history.  A sample of pus. This may be used to find out what is causing the infection.  Blood tests.  Imaging tests, such as an ultrasound, CT scan, or MRI. How is this treated? A small abscess that drains on its own may not need treatment. Treatment for larger abscesses may include:  Moist heat or heat pack applied to the area several times a day.  A procedure to drain the abscess (incision and drainage).  Antibiotic medicines. For a severe abscess, you may first get antibiotics through an IV and then change to antibiotics by mouth. Follow these instructions at home: Medicines   Take over-the-counter and prescription medicines only as told by your health care provider.  If you  were prescribed an antibiotic medicine, take it as told by your health care provider. Do not stop taking the antibiotic even if you start to feel better. Abscess care   If you have an abscess that has not drained, apply heat to the affected area. Use the heat source that your health care provider recommends, such as a moist heat pack or a heating pad. ? Place a towel between your skin and the heat source. ? Leave the heat on for 20-30 minutes. ? Remove the heat if your skin turns bright red. This is especially important if you are unable to feel pain, heat, or cold. You may have a greater risk of getting burned.  Follow instructions from your health care provider about how to take care of your abscess. Make sure you: ? Cover the abscess with a bandage (dressing). ? Change your dressing or gauze as told by your health care provider. ? Wash your hands with soap and water before you  change the dressing or gauze. If soap and water are not available, use hand sanitizer.  Check your abscess every day for signs of a worsening infection. Check for: ? More redness, swelling, or pain. ? More fluid or blood. ? Warmth. ? More pus or a bad smell. General instructions  To avoid spreading the infection: ? Do not share personal care items, towels, or hot tubs with others. ? Avoid making skin contact with other people.  Keep all follow-up visits as told by your health care provider. This is important. Contact a health care provider if you have:  More redness, swelling, or pain around your abscess.  More fluid or blood coming from your abscess.  Warm skin around your abscess.  More pus or a bad smell coming from your abscess.  A fever.  Muscle aches.  Chills or a general ill feeling. Get help right away if you:  Have severe pain.  See red streaks on your skin spreading away from the abscess. Summary  A skin abscess is an infected area on or under your skin that contains a collection of pus and other material.  A small abscess that drains on its own may not need treatment.  Treatment for larger abscesses may include having a procedure to drain the abscess and taking an antibiotic. This information is not intended to replace advice given to you by your health care provider. Make sure you discuss any questions you have with your health care provider. Document Revised: 11/05/2018 Document Reviewed: 08/28/2017 Elsevier Patient Education  2020 [employer].

## 2020-06-02 NOTE — Progress Notes (Signed)
GYN ENCOUNTER NOTE  Subjective:       Felicia Scott is a 24 y.o. [phone] female is here for gynecologic evaluation of the following issues:  1. "uncomfortable bump on right labia that popped and drained pus on 05/18/2020, but is still there"  Denies difficulty breathing or respiratory distress, chest pain, abdominal pain, excessive vaginal bleeding, dysuria, and leg pain or swelling.    Gynecologic History  No LMP recorded. (Menstrual status: IUD).  Contraception: IUD  Last Pap: 03/2019. Results were: normal   Obstetric History  OB History  Gravida Para Term Preterm AB Living  5 1 1   4 1   SAB TAB Ectopic Multiple Live Births  3 1     1     # Outcome Date GA Lbr Len/2nd Weight Sex Delivery Anes PTL Lv  5 Term 11/16/18 [redacted]w[redacted]d  6 lb 9 oz (2.977 kg) M Vag-Spont EPI N LIV     Complications: Oligohydramnios delivered  4 SAB 12/27/17          3 SAB           2 SAB           1 TAB             Past Medical History:  Diagnosis Date  . Asthma   . Depression     Past Surgical History:  Procedure Laterality Date  . APPENDECTOMY      Current Outpatient Medications on File Prior to Visit  Medication Sig Dispense Refill  . cyclobenzaprine (FLEXERIL) 5 MG tablet Take 1 tablet (5 mg total) by mouth 3 (three) times daily as needed for muscle spasms. 15 tablet 0  . levonorgestrel (MIRENA) 20 MCG/24HR IUD 1 each by Intrauterine route once.    . predniSONE (DELTASONE) 20 MG tablet Take by mouth.    . sertraline (ZOLOFT) 50 MG tablet Take 1 tablet (50 mg total) by mouth daily. 30 tablet 6  . valACYclovir (VALTREX) 500 MG tablet Take 1 tablet (500 mg total) by mouth daily. Can increase to twice a day for 5 days in the event of a recurrence 30 tablet 12   No current facility-administered medications on file prior to visit.    No Known Allergies  Social History   Socioeconomic History  . Marital status: Single    Spouse name: Not on file  . Number of children: Not on file  .  Years of education: Not on file  . Highest education level: Not on file  Occupational History  . Not on file  Tobacco Use  . Smoking status: Current Every Day Smoker    Types: Cigarettes  . Smokeless tobacco: Never Used  Vaping Use  . Vaping Use: Every day  . Substances: Nicotine, Flavoring  Substance and Sexual Activity  . Alcohol use: Yes    Comment: occasional   . Drug use: Not Currently    Types: Marijuana  . Sexual activity: Yes    Birth control/protection: I.U.D.  Other Topics Concern  . Not on file  Social History Narrative  . Not on file   Social Determinants of Health   Financial Resource Strain:   . Difficulty of Paying Living Expenses: Not on file  Food Insecurity:   . Worried About [redacted]w[redacted]d in the Last Year: Not on file  . Ran Out of Food in the Last Year: Not on file  Transportation Needs:   . Lack of Transportation (Medical): Not on file  . Lack  of Transportation (Non-Medical): Not on file  Physical Activity:   . Days of Exercise per Week: Not on file  . Minutes of Exercise per Session: Not on file  Stress:   . Feeling of Stress : Not on file  Social Connections:   . Frequency of Communication with Friends and Family: Not on file  . Frequency of Social Gatherings with Friends and Family: Not on file  . Attends Religious Services: Not on file  . Active Member of Clubs or Organizations: Not on file  . Attends Banker Meetings: Not on file  . Marital Status: Not on file  Intimate Partner Violence:   . Fear of Current or Ex-Partner: Not on file  . Emotionally Abused: Not on file  . Physically Abused: Not on file  . Sexually Abused: Not on file    Family History  Problem Relation Age of Onset  . Healthy Mother   . Healthy Father   . Breast cancer Maternal Grandmother   . Diabetes Maternal Grandmother   . Hypertension Neg Hx   . Ovarian cancer Neg Hx   . Colon cancer Neg Hx     The following portions of the patient's  history were reviewed and updated as appropriate: allergies, current medications, past family history, past medical history, past social history, past surgical history and problem list.  Review of Systems  ROS negative except as noted above. Information obtained from patient.   Objective:   BP (!) 100/56   Ht 5\' 4"  (1.626 m)   Wt 180 lb 3.2 oz (81.7 kg)   BMI 30.93 kg/m    CONSTITUTIONAL: Well-developed, well-nourished female in no acute distress.   PELVIC:  External Genitalia: Normal except for small pellet sized furncule   MUSCULOSKELETAL: Normal range of motion. No tenderness.  No cyanosis, clubbing, or edema.  Assessment:   1. Vulvar abscess   Plan:   Rx Omnicef, see orders.   Discussed home treatment measures.   Reviewed red flag symptoms and when to all.   RTC if symptoms worsen or fail to improve.    , CNM [facility], [facility] 06/02/20 11:54 AM

## 2020-06-28 DIAGNOSIS — Z419 Encounter for procedure for purposes other than remedying health state, unspecified: Secondary | ICD-10-CM | POA: Diagnosis not present

## 2020-07-29 DIAGNOSIS — Z419 Encounter for procedure for purposes other than remedying health state, unspecified: Secondary | ICD-10-CM | POA: Diagnosis not present

## 2020-08-29 DIAGNOSIS — Z419 Encounter for procedure for purposes other than remedying health state, unspecified: Secondary | ICD-10-CM | POA: Diagnosis not present

## 2020-09-26 DIAGNOSIS — Z419 Encounter for procedure for purposes other than remedying health state, unspecified: Secondary | ICD-10-CM | POA: Diagnosis not present

## 2020-10-10 DIAGNOSIS — Z20822 Contact with and (suspected) exposure to covid-19: Secondary | ICD-10-CM | POA: Diagnosis not present

## 2020-10-10 DIAGNOSIS — Z03818 Encounter for observation for suspected exposure to other biological agents ruled out: Secondary | ICD-10-CM | POA: Diagnosis not present

## 2020-10-27 DIAGNOSIS — Z419 Encounter for procedure for purposes other than remedying health state, unspecified: Secondary | ICD-10-CM | POA: Diagnosis not present

## 2020-11-22 DIAGNOSIS — R55 Syncope and collapse: Secondary | ICD-10-CM | POA: Diagnosis not present

## 2020-11-22 DIAGNOSIS — F331 Major depressive disorder, recurrent, moderate: Secondary | ICD-10-CM | POA: Diagnosis not present

## 2020-11-22 DIAGNOSIS — R519 Headache, unspecified: Secondary | ICD-10-CM | POA: Diagnosis not present

## 2020-11-22 DIAGNOSIS — M545 Low back pain, unspecified: Secondary | ICD-10-CM | POA: Diagnosis not present

## 2020-11-26 DIAGNOSIS — Z419 Encounter for procedure for purposes other than remedying health state, unspecified: Secondary | ICD-10-CM | POA: Diagnosis not present

## 2020-12-12 ENCOUNTER — Other Ambulatory Visit: Payer: Self-pay | Admitting: Family Medicine

## 2020-12-12 DIAGNOSIS — R519 Headache, unspecified: Secondary | ICD-10-CM

## 2020-12-27 ENCOUNTER — Ambulatory Visit: Admission: RE | Admit: 2020-12-27 | Payer: Medicaid Other | Source: Ambulatory Visit

## 2020-12-27 DIAGNOSIS — Z419 Encounter for procedure for purposes other than remedying health state, unspecified: Secondary | ICD-10-CM | POA: Diagnosis not present

## 2021-01-26 DIAGNOSIS — Z419 Encounter for procedure for purposes other than remedying health state, unspecified: Secondary | ICD-10-CM | POA: Diagnosis not present

## 2021-02-26 DIAGNOSIS — Z419 Encounter for procedure for purposes other than remedying health state, unspecified: Secondary | ICD-10-CM | POA: Diagnosis not present

## 2021-03-13 DIAGNOSIS — Z111 Encounter for screening for respiratory tuberculosis: Secondary | ICD-10-CM | POA: Diagnosis not present

## 2021-03-29 ENCOUNTER — Telehealth: Payer: Self-pay | Admitting: Certified Nurse Midwife

## 2021-03-29 DIAGNOSIS — Z3200 Encounter for pregnancy test, result unknown: Secondary | ICD-10-CM | POA: Diagnosis not present

## 2021-03-29 DIAGNOSIS — R87612 Low grade squamous intraepithelial lesion on cytologic smear of cervix (LGSIL): Secondary | ICD-10-CM | POA: Diagnosis not present

## 2021-03-29 DIAGNOSIS — Z419 Encounter for procedure for purposes other than remedying health state, unspecified: Secondary | ICD-10-CM | POA: Diagnosis not present

## 2021-03-29 DIAGNOSIS — N911 Secondary amenorrhea: Secondary | ICD-10-CM | POA: Diagnosis not present

## 2021-03-29 DIAGNOSIS — Z124 Encounter for screening for malignant neoplasm of cervix: Secondary | ICD-10-CM | POA: Diagnosis not present

## 2021-03-29 NOTE — Telephone Encounter (Signed)
Spoke with patient who presented concerns that her period was late. Patient states she has the Mirena (since March of 2021) and typically her periods are very regular. Pt states she has taken multiple at home pregnancy tests and all were negative. Asked patient if she would like to make an appointment to be seen and she stated she would call back to make an appointment due to her work schedule. No further questions or concerns voiced from patient at this time.

## 2021-03-29 NOTE — Telephone Encounter (Signed)
Pt called this morning with concerns about missed period, pt does have an IUD placed in 2020. Pt states that she always starts by the 30th and has not yet, took pregnancy test at home- negative. Please Advise.

## 2021-03-29 NOTE — Telephone Encounter (Signed)
Called patient back in regards to message, no answer, no VMB.

## 2021-03-30 DIAGNOSIS — Z111 Encounter for screening for respiratory tuberculosis: Secondary | ICD-10-CM | POA: Diagnosis not present

## 2021-04-09 ENCOUNTER — Encounter: Payer: Medicaid Other | Admitting: Certified Nurse Midwife

## 2021-04-09 DIAGNOSIS — Z20822 Contact with and (suspected) exposure to covid-19: Secondary | ICD-10-CM | POA: Diagnosis not present

## 2021-04-10 ENCOUNTER — Ambulatory Visit (LOCAL_COMMUNITY_HEALTH_CENTER): Payer: Medicaid Other

## 2021-04-10 ENCOUNTER — Other Ambulatory Visit: Payer: Medicaid Other

## 2021-04-10 ENCOUNTER — Other Ambulatory Visit: Payer: Self-pay

## 2021-04-10 VITALS — Wt 171.0 lb

## 2021-04-10 DIAGNOSIS — R7612 Nonspecific reaction to cell mediated immunity measurement of gamma interferon antigen response without active tuberculosis: Secondary | ICD-10-CM

## 2021-04-10 DIAGNOSIS — Z227 Latent tuberculosis: Secondary | ICD-10-CM | POA: Insufficient documentation

## 2021-04-11 ENCOUNTER — Other Ambulatory Visit: Payer: Self-pay | Admitting: Family Medicine

## 2021-04-11 DIAGNOSIS — R7612 Nonspecific reaction to cell mediated immunity measurement of gamma interferon antigen response without active tuberculosis: Secondary | ICD-10-CM

## 2021-04-11 LAB — CBC WITH DIFFERENTIAL/PLATELET
Basophils Absolute: 0.1 10*3/uL (ref 0.0–0.2)
Basos: 1 %
EOS (ABSOLUTE): 0.1 10*3/uL (ref 0.0–0.4)
Eos: 2 %
Hematocrit: 39.9 % (ref 34.0–46.6)
Hemoglobin: 13.1 g/dL (ref 11.1–15.9)
Immature Grans (Abs): 0 10*3/uL (ref 0.0–0.1)
Immature Granulocytes: 0 %
Lymphocytes Absolute: 1.7 10*3/uL (ref 0.7–3.1)
Lymphs: 31 %
MCH: 28.7 pg (ref 26.6–33.0)
MCHC: 32.8 g/dL (ref 31.5–35.7)
MCV: 87 fL (ref 79–97)
Monocytes Absolute: 0.4 10*3/uL (ref 0.1–0.9)
Monocytes: 7 %
Neutrophils Absolute: 3.2 10*3/uL (ref 1.4–7.0)
Neutrophils: 59 %
Platelets: 244 10*3/uL (ref 150–450)
RBC: 4.57 x10E6/uL (ref 3.77–5.28)
RDW: 13.3 % (ref 11.7–15.4)
WBC: 5.3 10*3/uL (ref 3.4–10.8)

## 2021-04-11 LAB — HEPATIC FUNCTION PANEL
ALT: 12 IU/L (ref 0–32)
AST: 16 IU/L (ref 0–40)
Albumin: 5 g/dL (ref 3.9–5.0)
Alkaline Phosphatase: 82 IU/L (ref 44–121)
Bilirubin Total: 0.4 mg/dL (ref 0.0–1.2)
Bilirubin, Direct: 0.13 mg/dL (ref 0.00–0.40)
Total Protein: 7.5 g/dL (ref 6.0–8.5)

## 2021-04-11 NOTE — Progress Notes (Signed)
Tuberculosis treatment orders  All patients are to be monitored per [place] and county TB policies.   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 Treat for latent TB per the following:  Isoniazid 300mg  1 po daily x 6 months. Monthly LFTs and Pyridoxine not indicated unless patient reports adverse reactions or has medication/medical additions.   +QFT 03/26/2021 CXR without Active TB 03/30/2021 See care everywhere for results

## 2021-04-11 NOTE — Progress Notes (Signed)
In clinic for EPI d/t +QFT.  CXR without Active TB in Care everywhere. Patient reports born in the [place] but traveled to [place] as a child "stayed a few weeks". States that she does have SOB and chest pain occasionally and dry cough currently, but has allergies, hx of asthma and vapes.  Co vaping could be causing all of the above.  Also admits to hx of anxiety.  Discussed LTBI tx and patient is interested in INH x 6 months.  Will draw baseline labs today. Richmond Campbell, RN   Does not currently have a PCP

## 2021-04-24 ENCOUNTER — Ambulatory Visit (LOCAL_COMMUNITY_HEALTH_CENTER): Payer: Medicaid Other

## 2021-04-24 ENCOUNTER — Other Ambulatory Visit: Payer: Self-pay

## 2021-04-24 VITALS — Wt 170.0 lb

## 2021-04-24 DIAGNOSIS — R7612 Nonspecific reaction to cell mediated immunity measurement of gamma interferon antigen response without active tuberculosis: Secondary | ICD-10-CM

## 2021-04-24 MED ORDER — ISONIAZID 300 MG PO TABS: 300.0000 mg | ORAL_TABLET | Freq: Every day | ORAL | 0 refills | Status: DC

## 2021-04-24 NOTE — Progress Notes (Signed)
In Nurse Clinic for LTBI / INH #1. Reports headaches that started one month ago and occurs  every 1-2 days that she describes as tension h/a lasting one hour and sees dark spots.  Takes tylenol (per pt, takes 4 rapid release 500mg  gel caps once per day) OR advil (800 mg once/day)for h/a, but with limited improvement. "Latent TB vs Active TB" info sheet given along with INH info sheet and TB coord. Contact card. Pt's PCP - Dr. at [facility] and [facility] signed to request records. INH 300 mg #30 dispensed today per order by Phineas Real, MD dated 04/11/2021 with instructions to take one pill daily by mouth. RN discussed with pt the commitment of taking INH for 6 months and to abstain from alcohol. Pt hesitantly agrees to the LTBI tx. Advised to contact TB coord with concerns and advised to see PCP for headache evaluation. Next TB Med Management appt scheduled for 05/22/2021 at 1 pm and the following TB med appt for 06/14/2021 at 1 pm. 06/16/2021, RN

## 2021-04-27 NOTE — Progress Notes (Signed)
Consult with Dr. Alvester Morin re: Tylenol use and ETOH.  LTBI orders modified to add LFTs monthly with INH appts. Richmond Campbell, RN

## 2021-04-27 NOTE — Progress Notes (Addendum)
Patient reported at [facility] start visit that she took Tylenol frequently for headaches and reported ETOH use.  Per Dr. Alvester Morin, complete LFTs at each LTBI visit. Richmond Campbell, RN

## 2021-04-28 DIAGNOSIS — Z419 Encounter for procedure for purposes other than remedying health state, unspecified: Secondary | ICD-10-CM | POA: Diagnosis not present

## 2021-05-22 ENCOUNTER — Other Ambulatory Visit: Payer: Self-pay

## 2021-05-22 ENCOUNTER — Ambulatory Visit (LOCAL_COMMUNITY_HEALTH_CENTER): Payer: Self-pay

## 2021-05-22 VITALS — Wt 173.0 lb

## 2021-05-22 DIAGNOSIS — R7612 Nonspecific reaction to cell mediated immunity measurement of gamma interferon antigen response without active tuberculosis: Secondary | ICD-10-CM

## 2021-05-22 NOTE — Progress Notes (Addendum)
06/13/2021 Cancelled Monthly LFT Lab order, due to patient deciding last minute after being sent to the lab on the day of her LTBI Med appointment 05/22/2021 that she did not want labs drawn that day. Hart Carwin, RN   Patient arrived before at 11:20 for her 1 pm appointment and was seen in Nurse Clinic around noon today due to her schedule.  INH # 2 was dispensed today per 04/11/2021 order by Lyndel Safe, MD.  Patient was sent to the lab today for her monthly LFT's, but later declined due to her schedule.  The lab was closed from 12:30-1:15 and patient left around 1 pm.  Next LTBI appointment is due 06/19/2021.  Patient made appointment for 06/14/2021 at 1 pm due to work schedule. Hart Carwin, RN

## 2021-05-29 DIAGNOSIS — Z419 Encounter for procedure for purposes other than remedying health state, unspecified: Secondary | ICD-10-CM | POA: Diagnosis not present

## 2021-06-04 ENCOUNTER — Other Ambulatory Visit: Payer: Self-pay

## 2021-06-04 ENCOUNTER — Ambulatory Visit (INDEPENDENT_AMBULATORY_CARE_PROVIDER_SITE_OTHER): Payer: Medicaid Other | Admitting: Certified Nurse Midwife

## 2021-06-04 ENCOUNTER — Encounter: Payer: Self-pay | Admitting: Certified Nurse Midwife

## 2021-06-04 VITALS — BP 100/59 | HR 97 | Ht 65.0 in | Wt 172.5 lb

## 2021-06-04 DIAGNOSIS — F418 Other specified anxiety disorders: Secondary | ICD-10-CM

## 2021-06-04 DIAGNOSIS — F419 Anxiety disorder, unspecified: Secondary | ICD-10-CM

## 2021-06-04 MED ORDER — SERTRALINE HCL 50 MG PO TABS: 50.0000 mg | ORAL_TABLET | Freq: Every day | ORAL | 1 refills | Status: DC

## 2021-06-04 NOTE — Patient Instructions (Signed)

## 2021-06-04 NOTE — Progress Notes (Signed)
HPI:  Felicia Scott is an 25 y.o. female history of anxiety and depression. She has been on medications in the past. Stopped because she was doing better. She lost weight and got a better job. Recently, she has worry, stress related to her partner. He may be deported.   Assessment: She is concerned about her child , states that he was taken away for 3 days and during that time all she could do was cry, she wasn't eating, sleeping, showering and could not take care of her son. Her grand parents had to do it.   Plan:  No evidence of imminent risk to self or others at present.   Supportive therapy provided about ongoing stressors. Discussed crisis plan, support from social network, calling 911, coming to the Emergency Department, and calling Suicide Hotline.  Subjective: Felicia Scott is a 25 y.o. female patient consulted for Depression.    Discussed use of zoloft in the past, states that it worked well for her and that she would like to restart it. She is open to counseling as well.   Past Psychiatric History: Anxiety and depression with medication treatment   None Past Medical History:  Diagnosis Date   Asthma    Depression    Past Surgical History:  Procedure Laterality Date   APPENDECTOMY      reports that she has been smoking e-cigarettes. She has never used smokeless tobacco. She reports current alcohol use. She reports that she does not currently use drugs after having used the following drugs: Marijuana. Family History  Problem Relation Age of Onset   Healthy Mother    Healthy Father    Breast cancer Maternal Grandmother    Diabetes Maternal Grandmother    Hypertension Neg Hx    Ovarian cancer Neg Hx    Colon cancer Neg Hx    Allergies:  No Known Allergies  Objective: Blood pressure (!) 100/59, pulse 97, height 5\' 5"  (1.651 m), weight 172 lb 8 oz (78.2 kg), last menstrual period 05/22/2021.Body mass index is 28.71 kg/m. No results found for this or any previous  visit (from the past 72 hour(s)).   Current Outpatient Medications  Medication Sig Dispense Refill   acetaminophen (TYLENOL) 500 MG tablet Take 500 mg by mouth every 6 (six) hours as needed for headache.     isoniazid (NYDRAZID) 300 MG tablet Take 1 tablet (300 mg total) by mouth daily. 30 tablet 0   levonorgestrel (MIRENA) 20 MCG/24HR IUD 1 each by Intrauterine route once.     ibuprofen (ADVIL) 200 MG tablet Take 200 mg by mouth every 6 (six) hours as needed for headache. (Patient not taking: No sig reported)     No current facility-administered medications for this visit.    Psychiatric Specialty Exam:  Presentation  General Appearance: Clean well groomed Eye Contact:direct eye contact Speech:no slurring  Speech Volume:normal Handedness:n/a  Mood and Affect  Mood:tearful, sad Affect:sad  Thought Process  Thought Processes:No data recorded Descriptions of Associations:No data recorded Orientation:person , place , time  Thought Content:No data recorded History of Schizophrenia/Schizoaffective disorder:none Duration of Psychotic Symptoms: for the past few weeks Hallucinations:none Ideas of Reference:none Suicidal Thoughts:no Homicidal Thoughts:no  Sensorium  Memory:normal Judgment:normal Insight:normal  Executive Functions  Concentration:normal Attention Span:normal Recall:normal  Psychomotor Activity  Psychomotor Activity:none Sleep  Sleep:not sleeping when partner was taken   Physical Exam: Physical Exam ROS Blood pressure (!) 100/59, pulse 97, height 5\' 5"  (1.651 m), weight 172 lb 8 oz (78.2 kg), last  menstrual period 05/22/2021. Body mass index is 28.71 kg/m.  GAD 7 : Generalized Anxiety Score 06/04/2021 09/30/2019 02/08/2019 12/28/2018  Nervous, Anxious, on Edge 3 1 2 3   Control/stop worrying 3 1 3 3   Worry too much - different things 3 3 3 3   Trouble relaxing 3 1 3 3   Restless 2 0 2 3  Easily annoyed or irritable 3 0 1 3  Afraid - awful might happen 3  1 3 3   Total GAD 7 Score 20 7 17 21   Anxiety Difficulty - Not difficult at all Somewhat difficult Not difficult at all    [facility] Office Visit from 06/04/2021 in [facility]  PHQ-9 Total Score 19        Medication orders placed, referral for counseling. Follow up 4 wks for medication check.   06/04/2021 1:59 PM

## 2021-06-13 NOTE — Addendum Note (Signed)
Addended byHart Carwin on: 06/13/2021 12:53 PM   Modules accepted: Orders

## 2021-06-28 DIAGNOSIS — Z419 Encounter for procedure for purposes other than remedying health state, unspecified: Secondary | ICD-10-CM | POA: Diagnosis not present

## 2021-07-03 ENCOUNTER — Telehealth (INDEPENDENT_AMBULATORY_CARE_PROVIDER_SITE_OTHER): Payer: Medicaid Other | Admitting: Certified Nurse Midwife

## 2021-07-03 ENCOUNTER — Encounter: Payer: Self-pay | Admitting: Certified Nurse Midwife

## 2021-07-03 DIAGNOSIS — Z79899 Other long term (current) drug therapy: Secondary | ICD-10-CM

## 2021-07-03 DIAGNOSIS — Z1331 Encounter for screening for depression: Secondary | ICD-10-CM | POA: Diagnosis not present

## 2021-07-03 MED ORDER — SERTRALINE HCL 50 MG PO TABS: 50.0000 mg | ORAL_TABLET | Freq: Every day | ORAL | 5 refills | Status: DC

## 2021-07-03 NOTE — Progress Notes (Signed)
Virtual Visit via Telephone Note  I connected with Lenore Cordia on 07/03/21 at 11:30 AM EST by telephone and verified that I am speaking with the correct person using two identifiers.  Location: Patient: at home Provider: at work   I discussed the limitations, risks, security and privacy concerns of performing an evaluation and management service by telephone and the availability of in person appointments. I also discussed with the patient that there may be a patient responsible charge related to this service. The patient expressed understanding and agreed to proceed.   History of Present Illness: Pt was started on zoloft 50 mg daily for anxiety and depression. Several weeks ago. She has some recent life events that has contributed to her stress level.    Observations/Objective: Pt state she is feeling better, has been able to cope better.   PHQ9 SCORE ONLY 07/03/2021 06/04/2021 09/30/2019  PHQ-9 Total Score 6 19 1    GAD 7 : Generalized Anxiety Score 07/03/2021 06/04/2021 09/30/2019 02/08/2019  Nervous, Anxious, on Edge 2 3 1 2   Control/stop worrying 1 3 1 3   Worry too much - different things 3 3 3 3   Trouble relaxing 2 3 1 3   Restless 0 2 0 2  Easily annoyed or irritable 1 3 0 1  Afraid - awful might happen 0 3 1 3   Total GAD 7 Score 9 20 7 17   Anxiety Difficulty - - Not difficult at all Somewhat difficult    Assessment and Plan: Doing well on current does, request to stay on this dose for now. Orders placed for refills x 6 months.   Follow Up Instructions: PRN    I discussed the assessment and treatment plan with the patient. The patient was provided an opportunity to ask questions and all were answered. The patient agreed with the plan and demonstrated an understanding of the instructions.   The patient was advised to call back or seek an in-person evaluation if the symptoms worsen or if the condition fails to improve as anticipated.  I provided 7 minutes of non-face-to-face  time during this encounter.   02/10/2019, CNM

## 2021-07-29 DIAGNOSIS — Z419 Encounter for procedure for purposes other than remedying health state, unspecified: Secondary | ICD-10-CM | POA: Diagnosis not present

## 2021-08-08 ENCOUNTER — Telehealth: Payer: Self-pay

## 2021-08-08 NOTE — Telephone Encounter (Signed)
Numerous attempts made to reschedule patient for LTBI tx appt.  TB RN texted with patient. Stated she didn't come to her appt d/t her son being sick.  Asked her if she would like to R/S TBM appt and she never answered back.    2 other phone call attempts made; patient hasn't responded to voicemails. Closed to TB f/u.Aileen Fass, RN

## 2021-08-09 DIAGNOSIS — R059 Cough, unspecified: Secondary | ICD-10-CM | POA: Diagnosis not present

## 2021-08-16 ENCOUNTER — Emergency Department: Payer: Medicaid Other

## 2021-08-16 ENCOUNTER — Other Ambulatory Visit: Payer: Self-pay

## 2021-08-16 ENCOUNTER — Emergency Department
Admission: EM | Admit: 2021-08-16 | Discharge: 2021-08-16 | Disposition: A | Discharge status: Home / Self Care | Payer: Medicaid Other | Attending: Emergency Medicine | Admitting: Emergency Medicine

## 2021-08-16 DIAGNOSIS — S161XXA Strain of muscle, fascia and tendon at neck level, initial encounter: Secondary | ICD-10-CM | POA: Insufficient documentation

## 2021-08-16 DIAGNOSIS — S0990XA Unspecified injury of head, initial encounter: Secondary | ICD-10-CM | POA: Diagnosis not present

## 2021-08-16 DIAGNOSIS — S0003XA Contusion of scalp, initial encounter: Secondary | ICD-10-CM | POA: Insufficient documentation

## 2021-08-16 DIAGNOSIS — S39012A Strain of muscle, fascia and tendon of lower back, initial encounter: Secondary | ICD-10-CM | POA: Diagnosis not present

## 2021-08-16 DIAGNOSIS — S29012A Strain of muscle and tendon of back wall of thorax, initial encounter: Secondary | ICD-10-CM | POA: Diagnosis not present

## 2021-08-16 DIAGNOSIS — S7002XA Contusion of left hip, initial encounter: Secondary | ICD-10-CM | POA: Insufficient documentation

## 2021-08-16 DIAGNOSIS — Z041 Encounter for examination and observation following transport accident: Secondary | ICD-10-CM | POA: Diagnosis not present

## 2021-08-16 DIAGNOSIS — Y92410 Unspecified street and highway as the place of occurrence of the external cause: Secondary | ICD-10-CM | POA: Diagnosis not present

## 2021-08-16 DIAGNOSIS — S29019A Strain of muscle and tendon of unspecified wall of thorax, initial encounter: Secondary | ICD-10-CM

## 2021-08-16 LAB — POC URINE PREG, ED: Preg Test, Ur: NEGATIVE

## 2021-08-16 MED ORDER — NAPROXEN 500 MG PO TABS: 500.0000 mg | ORAL_TABLET | Freq: Two times a day (BID) | ORAL | 0 refills | Status: DC

## 2021-08-16 MED ORDER — HYDROCODONE-ACETAMINOPHEN 5-325 MG PO TABS: 1.0000 | ORAL_TABLET | Freq: Four times a day (QID) | ORAL | 0 refills | Status: AC | PRN

## 2021-08-16 MED ORDER — METHOCARBAMOL 500 MG PO TABS: 500.0000 mg | ORAL_TABLET | Freq: Four times a day (QID) | ORAL | 0 refills | Status: DC | PRN

## 2021-08-16 MED ORDER — HYDROCODONE-ACETAMINOPHEN 5-325 MG PO TABS
1.0000 | ORAL_TABLET | Freq: Once | ORAL | Status: AC
  Administered 2021-08-16: 1 via ORAL
  Filled 2021-08-16: qty 1

## 2021-08-16 MED ORDER — ONDANSETRON 4 MG PO TBDP
4.0000 mg | ORAL_TABLET | Freq: Once | ORAL | Status: AC
  Administered 2021-08-16: 4 mg via ORAL
  Filled 2021-08-16: qty 1

## 2021-08-16 NOTE — Discharge Instructions (Addendum)
Follow-up with your primary care provider if any continued problems or concerns.  Remember that tomorrow he will be more sore in the morning than you are currently.  CT scan of your head and cervical spine were negative along with x-rays of your left hip and pelvis.  X-rays of your thoracic and lumbar spine were also negative for any fractures.  You may use ice or heat to your muscles as needed for discomfort.  Prescriptions were sent to your pharmacy.  The hydrocodone and methocarbamol should not be taken if you plan on driving or operating machinery.  These 2 medications may cause drowsiness.  The naproxen can be taken with food twice a day and will not cause drowsiness.  This medication is for inflammation and pain.
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
## 2021-08-17 ENCOUNTER — Telehealth: Payer: Self-pay

## 2021-08-17 NOTE — Telephone Encounter (Signed)
Transition Care Management Follow-up Telephone Call Date of discharge and from where: 08/16/2021 from [facility] How have you been since you were released from the hospital? Pt stated that she is still sore. Pt stated that she did not have any questions or concerns at this time.  Any questions or concerns? No  Items Reviewed: Did the pt receive and understand the discharge instructions provided? Yes  Medications obtained and verified? Yes  Other? No  Any new allergies since your discharge? No  Dietary orders reviewed? No Do you have support at home? Yes   Functional Questionnaire: (I = Independent and D = Dependent) ADLs: I  Bathing/Dressing- I  Meal Prep- I  Eating- I  Maintaining continence- I  Transferring/Ambulation- I  Managing Meds- I   Follow up appointments reviewed:  PCP Hospital f/u appt confirmed? No   Specialist Hospital f/u appt confirmed? No   Are transportation arrangements needed? No  If their condition worsens, is the pt aware to call PCP or go to the Emergency Dept.? Yes Was the patient provided with contact information for the PCP's office or ED? Yes Was to pt encouraged to call back with questions or concerns? Yes

## 2021-08-29 DIAGNOSIS — Z419 Encounter for procedure for purposes other than remedying health state, unspecified: Secondary | ICD-10-CM | POA: Diagnosis not present

## 2021-09-19 ENCOUNTER — Encounter: Payer: Self-pay | Admitting: Certified Nurse Midwife

## 2021-09-26 DIAGNOSIS — Z419 Encounter for procedure for purposes other than remedying health state, unspecified: Secondary | ICD-10-CM | POA: Diagnosis not present

## 2021-10-16 DIAGNOSIS — K648 Other hemorrhoids: Secondary | ICD-10-CM | POA: Diagnosis not present

## 2021-10-16 DIAGNOSIS — K5900 Constipation, unspecified: Secondary | ICD-10-CM | POA: Diagnosis not present

## 2021-10-27 DIAGNOSIS — N9089 Other specified noninflammatory disorders of vulva and perineum: Secondary | ICD-10-CM | POA: Diagnosis not present

## 2021-10-27 DIAGNOSIS — Z419 Encounter for procedure for purposes other than remedying health state, unspecified: Secondary | ICD-10-CM | POA: Diagnosis not present

## 2021-10-27 DIAGNOSIS — Z0000 Encounter for general adult medical examination without abnormal findings: Secondary | ICD-10-CM | POA: Diagnosis not present

## 2021-11-26 DIAGNOSIS — Z419 Encounter for procedure for purposes other than remedying health state, unspecified: Secondary | ICD-10-CM | POA: Diagnosis not present

## 2021-12-27 DIAGNOSIS — Z419 Encounter for procedure for purposes other than remedying health state, unspecified: Secondary | ICD-10-CM | POA: Diagnosis not present

## 2022-01-26 DIAGNOSIS — Z419 Encounter for procedure for purposes other than remedying health state, unspecified: Secondary | ICD-10-CM | POA: Diagnosis not present

## 2022-02-26 DIAGNOSIS — Z419 Encounter for procedure for purposes other than remedying health state, unspecified: Secondary | ICD-10-CM | POA: Diagnosis not present

## 2022-03-05 DIAGNOSIS — Z113 Encounter for screening for infections with a predominantly sexual mode of transmission: Secondary | ICD-10-CM | POA: Diagnosis not present

## 2022-03-27 DIAGNOSIS — Z1389 Encounter for screening for other disorder: Secondary | ICD-10-CM | POA: Diagnosis not present

## 2022-03-27 DIAGNOSIS — N751 Abscess of Bartholin's gland: Secondary | ICD-10-CM | POA: Diagnosis not present

## 2022-03-29 DIAGNOSIS — Z419 Encounter for procedure for purposes other than remedying health state, unspecified: Secondary | ICD-10-CM | POA: Diagnosis not present

## 2022-04-17 DIAGNOSIS — Z113 Encounter for screening for infections with a predominantly sexual mode of transmission: Secondary | ICD-10-CM | POA: Diagnosis not present

## 2022-04-17 DIAGNOSIS — Z1389 Encounter for screening for other disorder: Secondary | ICD-10-CM | POA: Diagnosis not present

## 2022-04-17 DIAGNOSIS — R87610 Atypical squamous cells of undetermined significance on cytologic smear of cervix (ASC-US): Secondary | ICD-10-CM | POA: Diagnosis not present

## 2022-04-17 DIAGNOSIS — R87612 Low grade squamous intraepithelial lesion on cytologic smear of cervix (LGSIL): Secondary | ICD-10-CM | POA: Diagnosis not present

## 2022-04-28 DIAGNOSIS — Z419 Encounter for procedure for purposes other than remedying health state, unspecified: Secondary | ICD-10-CM | POA: Diagnosis not present

## 2022-05-29 DIAGNOSIS — Z419 Encounter for procedure for purposes other than remedying health state, unspecified: Secondary | ICD-10-CM | POA: Diagnosis not present

## 2022-06-03 DIAGNOSIS — Z113 Encounter for screening for infections with a predominantly sexual mode of transmission: Secondary | ICD-10-CM | POA: Diagnosis not present

## 2022-06-03 DIAGNOSIS — F509 Eating disorder, unspecified: Secondary | ICD-10-CM | POA: Diagnosis not present

## 2022-06-03 DIAGNOSIS — Z3002 Counseling and instruction in natural family planning to avoid pregnancy: Secondary | ICD-10-CM | POA: Diagnosis not present

## 2022-06-03 DIAGNOSIS — Z227 Latent tuberculosis: Secondary | ICD-10-CM | POA: Diagnosis not present

## 2022-06-03 DIAGNOSIS — Z30432 Encounter for removal of intrauterine contraceptive device: Secondary | ICD-10-CM | POA: Diagnosis not present

## 2022-06-03 DIAGNOSIS — F339 Major depressive disorder, recurrent, unspecified: Secondary | ICD-10-CM | POA: Diagnosis not present

## 2022-06-03 DIAGNOSIS — N941 Unspecified dyspareunia: Secondary | ICD-10-CM | POA: Diagnosis not present

## 2022-06-03 DIAGNOSIS — Z01411 Encounter for gynecological examination (general) (routine) with abnormal findings: Secondary | ICD-10-CM | POA: Diagnosis not present

## 2022-06-04 ENCOUNTER — Other Ambulatory Visit: Payer: Self-pay | Admitting: Certified Nurse Midwife

## 2022-06-04 DIAGNOSIS — R1011 Right upper quadrant pain: Secondary | ICD-10-CM

## 2022-06-04 DIAGNOSIS — R160 Hepatomegaly, not elsewhere classified: Secondary | ICD-10-CM

## 2022-06-12 ENCOUNTER — Ambulatory Visit
Admission: RE | Admit: 2022-06-12 | Discharge: 2022-06-12 | Disposition: A | Discharge status: Home / Self Care | Payer: Medicaid Other | Source: Ambulatory Visit | Attending: Certified Nurse Midwife | Admitting: Certified Nurse Midwife

## 2022-06-12 DIAGNOSIS — R160 Hepatomegaly, not elsewhere classified: Secondary | ICD-10-CM | POA: Insufficient documentation

## 2022-06-12 DIAGNOSIS — R1011 Right upper quadrant pain: Secondary | ICD-10-CM | POA: Diagnosis not present

## 2022-06-12 DIAGNOSIS — K5900 Constipation, unspecified: Secondary | ICD-10-CM | POA: Diagnosis not present

## 2022-06-12 DIAGNOSIS — R5383 Other fatigue: Secondary | ICD-10-CM | POA: Diagnosis not present

## 2022-06-12 DIAGNOSIS — D508 Other iron deficiency anemias: Secondary | ICD-10-CM | POA: Diagnosis not present

## 2022-06-28 DIAGNOSIS — Z419 Encounter for procedure for purposes other than remedying health state, unspecified: Secondary | ICD-10-CM | POA: Diagnosis not present

## 2022-07-17 DIAGNOSIS — Z01411 Encounter for gynecological examination (general) (routine) with abnormal findings: Secondary | ICD-10-CM | POA: Diagnosis not present

## 2022-07-17 DIAGNOSIS — F509 Eating disorder, unspecified: Secondary | ICD-10-CM | POA: Diagnosis not present

## 2022-07-17 DIAGNOSIS — Z113 Encounter for screening for infections with a predominantly sexual mode of transmission: Secondary | ICD-10-CM | POA: Diagnosis not present

## 2022-07-17 DIAGNOSIS — F339 Major depressive disorder, recurrent, unspecified: Secondary | ICD-10-CM | POA: Diagnosis not present

## 2022-07-17 DIAGNOSIS — Z3002 Counseling and instruction in natural family planning to avoid pregnancy: Secondary | ICD-10-CM | POA: Diagnosis not present

## 2022-07-17 DIAGNOSIS — Z30432 Encounter for removal of intrauterine contraceptive device: Secondary | ICD-10-CM | POA: Diagnosis not present

## 2022-07-17 DIAGNOSIS — Z227 Latent tuberculosis: Secondary | ICD-10-CM | POA: Diagnosis not present

## 2022-07-17 DIAGNOSIS — N941 Unspecified dyspareunia: Secondary | ICD-10-CM | POA: Diagnosis not present

## 2022-07-29 DIAGNOSIS — Z419 Encounter for procedure for purposes other than remedying health state, unspecified: Secondary | ICD-10-CM | POA: Diagnosis not present

## 2022-08-29 DIAGNOSIS — Z419 Encounter for procedure for purposes other than remedying health state, unspecified: Secondary | ICD-10-CM | POA: Diagnosis not present

## 2022-08-30 DIAGNOSIS — F339 Major depressive disorder, recurrent, unspecified: Secondary | ICD-10-CM | POA: Diagnosis not present

## 2022-08-30 DIAGNOSIS — Z227 Latent tuberculosis: Secondary | ICD-10-CM | POA: Diagnosis not present

## 2022-08-30 DIAGNOSIS — F509 Eating disorder, unspecified: Secondary | ICD-10-CM | POA: Diagnosis not present

## 2022-08-30 DIAGNOSIS — Z01411 Encounter for gynecological examination (general) (routine) with abnormal findings: Secondary | ICD-10-CM | POA: Diagnosis not present

## 2022-08-30 DIAGNOSIS — Z113 Encounter for screening for infections with a predominantly sexual mode of transmission: Secondary | ICD-10-CM | POA: Diagnosis not present

## 2022-08-30 DIAGNOSIS — N941 Unspecified dyspareunia: Secondary | ICD-10-CM | POA: Diagnosis not present

## 2022-08-30 DIAGNOSIS — Z3002 Counseling and instruction in natural family planning to avoid pregnancy: Secondary | ICD-10-CM | POA: Diagnosis not present

## 2022-08-30 DIAGNOSIS — Z30432 Encounter for removal of intrauterine contraceptive device: Secondary | ICD-10-CM | POA: Diagnosis not present

## 2022-09-27 DIAGNOSIS — Z419 Encounter for procedure for purposes other than remedying health state, unspecified: Secondary | ICD-10-CM | POA: Diagnosis not present

## 2022-10-16 DIAGNOSIS — N760 Acute vaginitis: Secondary | ICD-10-CM | POA: Diagnosis not present

## 2022-10-16 DIAGNOSIS — Z1331 Encounter for screening for depression: Secondary | ICD-10-CM | POA: Diagnosis not present

## 2022-10-16 DIAGNOSIS — Z1389 Encounter for screening for other disorder: Secondary | ICD-10-CM | POA: Diagnosis not present

## 2022-10-28 DIAGNOSIS — Z419 Encounter for procedure for purposes other than remedying health state, unspecified: Secondary | ICD-10-CM | POA: Diagnosis not present

## 2022-11-11 ENCOUNTER — Telehealth: Payer: Self-pay | Admitting: Family Medicine

## 2022-11-11 NOTE — Telephone Encounter (Signed)
LVM to call back to schedule apt with PCP. AS, CMA 

## 2022-11-16 DIAGNOSIS — M5442 Lumbago with sciatica, left side: Secondary | ICD-10-CM | POA: Diagnosis not present

## 2022-11-27 DIAGNOSIS — Z419 Encounter for procedure for purposes other than remedying health state, unspecified: Secondary | ICD-10-CM | POA: Diagnosis not present

## 2022-12-18 DIAGNOSIS — M545 Low back pain, unspecified: Secondary | ICD-10-CM | POA: Diagnosis not present

## 2022-12-18 DIAGNOSIS — Z1389 Encounter for screening for other disorder: Secondary | ICD-10-CM | POA: Diagnosis not present

## 2022-12-28 DIAGNOSIS — Z419 Encounter for procedure for purposes other than remedying health state, unspecified: Secondary | ICD-10-CM | POA: Diagnosis not present

## 2023-01-27 DIAGNOSIS — Z419 Encounter for procedure for purposes other than remedying health state, unspecified: Secondary | ICD-10-CM | POA: Diagnosis not present

## 2023-02-27 DIAGNOSIS — Z419 Encounter for procedure for purposes other than remedying health state, unspecified: Secondary | ICD-10-CM | POA: Diagnosis not present

## 2023-03-26 ENCOUNTER — Telehealth: Payer: Self-pay

## 2023-03-26 NOTE — Telephone Encounter (Addendum)
Pt can be reached at certain times: Monday - Anytime Wednesday - before 10 AM  Additional info by Dr Wyvonnia Lora: +QFT 03/26/2021  CXR: neg 03/30/2021  HIV: neg 04/10/2021  EPI: 04/10/2021   She has an old letter from 04/10/2021 where she is cleared for work.   But she probably still needs at least a TB screen for work purposes.   She was on INH for months and then quit/lost to follow up.   We can offer her Rifampin for 4 months, I don't see any meds that would be a problem. But if she is not committed to completing the treatment, best not to start. If she is interested, just need to update her current medications and make appt.   Thanks,  Dr. Wyvonnia Lora    ----- Message from Nurse Nadean Corwin sent at 03/26/2023  4:40 PM EDT ----- Regarding: needs TB Screen and re-initiate LTBI Felicia Scott,  This patient walked in today.  Amy spoke with her and this is Amy message regarding her encounter today.  Would you mind reaching out to her?  Spoke with Felicia Scott. She is starting clinical and will be need a tb clearance form. I am assuming she means TB screening form. She also would like more information about treatment since she didn't finish it in the past. She said the best day to reach her would be Monday. [phone] any time. Or Wednesday before 10am. The rest of the days she is in class and it is hard for her to answer the phone. Amy

## 2023-03-30 DIAGNOSIS — Z419 Encounter for procedure for purposes other than remedying health state, unspecified: Secondary | ICD-10-CM | POA: Diagnosis not present

## 2023-04-02 NOTE — Telephone Encounter (Signed)
Phone call to pt at [phone]. Pt needs form for school addressing TB testing. She explained to them she has tested positive for TB before. Pt understands she should not be tested for TB with bloodwork or skin test.  Discussed "TB Screening." Explained this is what is used when someone has tested positive for TB in the past. Will go over symptoms during a screening.  Reviewed her hx of + QFT, negative chest x-ray, and the INH she started. Discussed latent TB medication and Rifampin option. She is not interested in taking latent TB medication at this time. Pt only wants TB Screening at this time. Scheduled for Monday 04/07/23. Pt understands there is a charge for this service/form.

## 2023-04-07 ENCOUNTER — Ambulatory Visit (LOCAL_COMMUNITY_HEALTH_CENTER): Payer: Self-pay

## 2023-04-07 DIAGNOSIS — R7612 Nonspecific reaction to cell mediated immunity measurement of gamma interferon antigen response without active tuberculosis: Secondary | ICD-10-CM

## 2023-04-07 NOTE — Progress Notes (Signed)
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 Denies all symptoms. Positive QFT 03/26/2021 Negative chest xray 03/30/2021 Not interested in LTBI meds. Copy of Record of Tuberculosis Screening completed and given to patient. Questions answered and reports understanding. Jerel Shepherd, RN

## 2023-04-28 DIAGNOSIS — Z3200 Encounter for pregnancy test, result unknown: Secondary | ICD-10-CM | POA: Diagnosis not present

## 2023-04-28 DIAGNOSIS — Z3009 Encounter for other general counseling and advice on contraception: Secondary | ICD-10-CM | POA: Diagnosis not present

## 2023-04-28 DIAGNOSIS — Z1389 Encounter for screening for other disorder: Secondary | ICD-10-CM | POA: Diagnosis not present

## 2023-04-28 DIAGNOSIS — N911 Secondary amenorrhea: Secondary | ICD-10-CM | POA: Diagnosis not present

## 2023-04-28 DIAGNOSIS — Z1159 Encounter for screening for other viral diseases: Secondary | ICD-10-CM | POA: Diagnosis not present

## 2023-04-28 DIAGNOSIS — Z113 Encounter for screening for infections with a predominantly sexual mode of transmission: Secondary | ICD-10-CM | POA: Diagnosis not present

## 2023-04-29 DIAGNOSIS — Z419 Encounter for procedure for purposes other than remedying health state, unspecified: Secondary | ICD-10-CM | POA: Diagnosis not present

## 2023-04-30 DIAGNOSIS — M545 Low back pain, unspecified: Secondary | ICD-10-CM | POA: Diagnosis not present

## 2023-04-30 DIAGNOSIS — Z1389 Encounter for screening for other disorder: Secondary | ICD-10-CM | POA: Diagnosis not present

## 2023-05-30 DIAGNOSIS — Z419 Encounter for procedure for purposes other than remedying health state, unspecified: Secondary | ICD-10-CM | POA: Diagnosis not present

## 2023-06-29 DIAGNOSIS — Z419 Encounter for procedure for purposes other than remedying health state, unspecified: Secondary | ICD-10-CM | POA: Diagnosis not present

## 2023-07-30 DIAGNOSIS — Z419 Encounter for procedure for purposes other than remedying health state, unspecified: Secondary | ICD-10-CM | POA: Diagnosis not present

## 2023-08-04 DIAGNOSIS — Z1389 Encounter for screening for other disorder: Secondary | ICD-10-CM | POA: Diagnosis not present

## 2023-08-04 DIAGNOSIS — F411 Generalized anxiety disorder: Secondary | ICD-10-CM | POA: Diagnosis not present

## 2023-08-04 DIAGNOSIS — F331 Major depressive disorder, recurrent, moderate: Secondary | ICD-10-CM | POA: Diagnosis not present

## 2023-08-06 ENCOUNTER — Other Ambulatory Visit (HOSPITAL_COMMUNITY)
Admission: RE | Admit: 2023-08-06 | Discharge: 2023-08-06 | Disposition: A | Discharge status: Home / Self Care | Payer: Medicaid Other | Source: Ambulatory Visit

## 2023-08-06 ENCOUNTER — Ambulatory Visit: Payer: Medicaid Other

## 2023-08-06 VITALS — BP 105/67 | HR 76 | Ht 65.0 in | Wt 183.0 lb

## 2023-08-06 DIAGNOSIS — N898 Other specified noninflammatory disorders of vagina: Secondary | ICD-10-CM | POA: Insufficient documentation

## 2023-08-06 DIAGNOSIS — Z113 Encounter for screening for infections with a predominantly sexual mode of transmission: Secondary | ICD-10-CM

## 2023-08-06 NOTE — Progress Notes (Signed)
   GYN ENCOUNTER  Encounter for vaginal irritation   Subjective  HPI: Felicia Scott is a 28 y.o. [phone] who presents today for vaginal itching with discharge in the last few weeks that has resolved in the last few days.   She is currently sexually active with several partners. She is having regular periods and using condoms for contraception.  Past Medical History:  Diagnosis Date   Asthma    Depression    Past Surgical History:  Procedure Laterality Date   APPENDECTOMY     OB History     Gravida  5   Para  1   Term  1   Preterm      AB  4   Living  1      SAB  3   IAB  1   Ectopic      Multiple      Live Births  1          No Known Allergies  Review of Systems  12 point ROS negative except for pertinent positives noted in HPO above.   Objective  BP 105/67   Pulse 76   Ht 5' 5 (1.651 m)   Wt 183 lb (83 kg)   BMI 30.45 kg/m   Physical examination GENERAL APPEARANCE: alert, well appearing LUNGS: normal work of breathing HEART: normal heart rate  Assessment/Plan - Self-collection of vaginal swab for BV/yeast/STI screening. Desires STI screening labs as well. Will follow up based on results.  - Pap smear UTD through [facility], does not have results in Epic, will request. Recommended scheduling annual exam and pap smear within next year.  - Reviewed contraception options. Has not like COCs, implant, or Nuvaring. May be interested in IUD placement again.   Lauraine PARAS Cejay Cambre, CNM  08/06/23 10:23 AM

## 2023-08-07 ENCOUNTER — Other Ambulatory Visit: Payer: Self-pay

## 2023-08-07 DIAGNOSIS — N760 Acute vaginitis: Secondary | ICD-10-CM

## 2023-08-07 LAB — CERVICOVAGINAL ANCILLARY ONLY
Bacterial Vaginitis (gardnerella): POSITIVE — AB
Candida Glabrata: NEGATIVE
Candida Vaginitis: NEGATIVE
Chlamydia: NEGATIVE
Comment: NEGATIVE
Comment: NEGATIVE
Comment: NEGATIVE
Comment: NEGATIVE
Comment: NEGATIVE
Comment: NORMAL
Neisseria Gonorrhea: NEGATIVE
Trichomonas: NEGATIVE

## 2023-08-07 LAB — HEP, RPR, HIV PANEL
HIV Screen 4th Generation wRfx: NONREACTIVE
Hepatitis B Surface Ag: NEGATIVE
RPR Ser Ql: NONREACTIVE

## 2023-08-07 MED ORDER — METRONIDAZOLE 500 MG PO TABS: 500.0000 mg | ORAL_TABLET | Freq: Two times a day (BID) | ORAL | 0 refills | Status: AC

## 2023-08-12 DIAGNOSIS — F331 Major depressive disorder, recurrent, moderate: Secondary | ICD-10-CM | POA: Diagnosis not present

## 2023-08-12 DIAGNOSIS — F411 Generalized anxiety disorder: Secondary | ICD-10-CM | POA: Diagnosis not present

## 2023-08-30 DIAGNOSIS — Z419 Encounter for procedure for purposes other than remedying health state, unspecified: Secondary | ICD-10-CM | POA: Diagnosis not present

## 2023-09-10 DIAGNOSIS — F411 Generalized anxiety disorder: Secondary | ICD-10-CM | POA: Diagnosis not present

## 2023-09-10 DIAGNOSIS — Z1389 Encounter for screening for other disorder: Secondary | ICD-10-CM | POA: Diagnosis not present

## 2023-09-10 DIAGNOSIS — F331 Major depressive disorder, recurrent, moderate: Secondary | ICD-10-CM | POA: Diagnosis not present

## 2023-09-27 DIAGNOSIS — Z23 Encounter for immunization: Secondary | ICD-10-CM | POA: Diagnosis not present

## 2023-09-27 DIAGNOSIS — O219 Vomiting of pregnancy, unspecified: Secondary | ICD-10-CM | POA: Diagnosis not present

## 2023-09-27 DIAGNOSIS — Z419 Encounter for procedure for purposes other than remedying health state, unspecified: Secondary | ICD-10-CM | POA: Diagnosis not present

## 2023-09-27 DIAGNOSIS — Z3201 Encounter for pregnancy test, result positive: Secondary | ICD-10-CM | POA: Diagnosis not present

## 2023-09-27 DIAGNOSIS — Z3200 Encounter for pregnancy test, result unknown: Secondary | ICD-10-CM | POA: Diagnosis not present

## 2023-09-27 DIAGNOSIS — Z1331 Encounter for screening for depression: Secondary | ICD-10-CM | POA: Diagnosis not present

## 2023-09-27 DIAGNOSIS — Z1389 Encounter for screening for other disorder: Secondary | ICD-10-CM | POA: Diagnosis not present

## 2023-10-01 DIAGNOSIS — F411 Generalized anxiety disorder: Secondary | ICD-10-CM | POA: Diagnosis not present

## 2023-10-01 DIAGNOSIS — F331 Major depressive disorder, recurrent, moderate: Secondary | ICD-10-CM | POA: Diagnosis not present

## 2023-10-22 ENCOUNTER — Emergency Department
Admission: EM | Admit: 2023-10-22 | Discharge: 2023-10-22 | Disposition: A | Discharge status: Home / Self Care | Attending: Emergency Medicine | Admitting: Emergency Medicine

## 2023-10-22 ENCOUNTER — Other Ambulatory Visit: Payer: Self-pay

## 2023-10-22 ENCOUNTER — Emergency Department

## 2023-10-22 ENCOUNTER — Encounter: Payer: Self-pay | Admitting: Intensive Care

## 2023-10-22 DIAGNOSIS — Z3481 Encounter for supervision of other normal pregnancy, first trimester: Secondary | ICD-10-CM | POA: Diagnosis not present

## 2023-10-22 DIAGNOSIS — Z3A01 Less than 8 weeks gestation of pregnancy: Secondary | ICD-10-CM | POA: Diagnosis not present

## 2023-10-22 DIAGNOSIS — Z124 Encounter for screening for malignant neoplasm of cervix: Secondary | ICD-10-CM | POA: Diagnosis not present

## 2023-10-22 DIAGNOSIS — O26891 Other specified pregnancy related conditions, first trimester: Secondary | ICD-10-CM | POA: Insufficient documentation

## 2023-10-22 DIAGNOSIS — R87610 Atypical squamous cells of undetermined significance on cytologic smear of cervix (ASC-US): Secondary | ICD-10-CM | POA: Diagnosis not present

## 2023-10-22 DIAGNOSIS — R1032 Left lower quadrant pain: Secondary | ICD-10-CM | POA: Diagnosis not present

## 2023-10-22 DIAGNOSIS — R102 Pelvic and perineal pain: Secondary | ICD-10-CM | POA: Insufficient documentation

## 2023-10-22 DIAGNOSIS — R109 Unspecified abdominal pain: Secondary | ICD-10-CM | POA: Insufficient documentation

## 2023-10-22 DIAGNOSIS — F411 Generalized anxiety disorder: Secondary | ICD-10-CM | POA: Diagnosis not present

## 2023-10-22 DIAGNOSIS — O26851 Spotting complicating pregnancy, first trimester: Secondary | ICD-10-CM | POA: Diagnosis not present

## 2023-10-22 DIAGNOSIS — Z1389 Encounter for screening for other disorder: Secondary | ICD-10-CM | POA: Diagnosis not present

## 2023-10-22 LAB — POC URINE PREG, ED: Preg Test, Ur: POSITIVE — AB

## 2023-10-22 LAB — COMPREHENSIVE METABOLIC PANEL
ALT: 20 U/L (ref 0–44)
AST: 22 U/L (ref 15–41)
Albumin: 4.2 g/dL (ref 3.5–5.0)
Alkaline Phosphatase: 52 U/L (ref 38–126)
Anion gap: 8 (ref 5–15)
BUN: 12 mg/dL (ref 6–20)
CO2: 22 mmol/L (ref 22–32)
Calcium: 9.2 mg/dL (ref 8.9–10.3)
Chloride: 104 mmol/L (ref 98–111)
Creatinine, Ser: 0.64 mg/dL (ref 0.44–1.00)
GFR, Estimated: >60 mL/min (ref >60)
Glucose, Bld: 80 mg/dL (ref 70–99)
Potassium: 3.7 mmol/L (ref 3.5–5.1)
Sodium: 134 mmol/L — ABNORMAL LOW (ref 135–145)
Total Bilirubin: 0.5 mg/dL (ref 0.0–1.2)
Total Protein: 7.5 g/dL (ref 6.5–8.1)

## 2023-10-22 LAB — CBC WITH DIFFERENTIAL/PLATELET
Abs Immature Granulocytes: 0.01 10*3/uL (ref 0.00–0.07)
Basophils Absolute: 0 10*3/uL (ref 0.0–0.1)
Basophils Relative: 1 %
Eosinophils Absolute: 0.1 10*3/uL (ref 0.0–0.5)
Eosinophils Relative: 2 %
HCT: 38.8 % (ref 36.0–46.0)
Hemoglobin: 12.8 g/dL (ref 12.0–15.0)
Immature Granulocytes: 0 %
Lymphocytes Relative: 33 %
Lymphs Abs: 2 10*3/uL (ref 0.7–4.0)
MCH: 29.6 pg (ref 26.0–34.0)
MCHC: 33 g/dL (ref 30.0–36.0)
MCV: 89.8 fL (ref 80.0–100.0)
Monocytes Absolute: 0.6 10*3/uL (ref 0.1–1.0)
Monocytes Relative: 10 %
Neutro Abs: 3.2 10*3/uL (ref 1.7–7.7)
Neutrophils Relative %: 54 %
Platelets: 251 10*3/uL (ref 150–400)
RBC: 4.32 MIL/uL (ref 3.87–5.11)
RDW: 13.5 % (ref 11.5–15.5)
WBC: 5.9 10*3/uL (ref 4.0–10.5)
nRBC: 0 % (ref 0.0–0.2)

## 2023-10-22 LAB — URINALYSIS, ROUTINE W REFLEX MICROSCOPIC
Bilirubin Urine: NEGATIVE
Glucose, UA: NEGATIVE mg/dL
Hgb urine dipstick: NEGATIVE
Ketones, ur: NEGATIVE mg/dL
Leukocytes,Ua: NEGATIVE
Nitrite: NEGATIVE
Protein, ur: NEGATIVE mg/dL
Specific Gravity, Urine: 1.032 — ABNORMAL HIGH (ref 1.005–1.030)
pH: 5 (ref 5.0–8.0)

## 2023-10-22 LAB — HCG, QUANTITATIVE, PREGNANCY: hCG, Beta Chain, Quant, S: 170148 m[IU]/mL — ABNORMAL HIGH (ref <5)

## 2023-10-22 NOTE — ED Triage Notes (Signed)
 Patient reports she is [redacted] weeks pregnant and having pelvic pain that started Sunday and has progressively gotten worse. Seen at [facility] today and sent to ER for [place] to rule out ectopic pregnancy. Reports some intermittent spotting

## 2023-10-23 ENCOUNTER — Other Ambulatory Visit: Payer: Self-pay | Admitting: Family Medicine

## 2023-10-23 DIAGNOSIS — Z3481 Encounter for supervision of other normal pregnancy, first trimester: Secondary | ICD-10-CM

## 2023-10-23 DIAGNOSIS — R1032 Left lower quadrant pain: Secondary | ICD-10-CM

## 2023-10-29 DIAGNOSIS — F411 Generalized anxiety disorder: Secondary | ICD-10-CM | POA: Diagnosis not present

## 2023-10-29 DIAGNOSIS — F331 Major depressive disorder, recurrent, moderate: Secondary | ICD-10-CM | POA: Diagnosis not present

## 2023-10-30 NOTE — Progress Notes (Signed)
 New OB Intake  I connected with  Felicia Scott on 10/31/23 at  2:15 PM EDT by telephone and verified that I am speaking with the correct person using two identifiers. Nurse is located at [facility] and pt is located at home.  I discussed the limitations, risks, security and privacy concerns of performing an evaluation and management service by telephone and the availability of in person appointments. I also discussed with the patient that there may be a patient responsible charge related to this service. The patient expressed understanding and agreed to proceed.  I explained I am completing New OB Intake today. We discussed her EDD of 06/03/24 that is based on LMP of 08/28/23. Pt is G5/P1. I reviewed her allergies, medications, Medical/Surgical/OB history, and appropriate screenings. There are no cats in the home. Based on history, this is a/an pregnancy uncomplicated . Her obstetrical history is significant for  N/A .  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  Anatomy US Explained Anatomy US will be done at 20 weeks of gestational age.  Labs Discussed genetic screening with patient. Patient desires genetic testing to be drawn at new OB visit. Discussed possible labs to be drawn at new OB appointment.  COVID Vaccine Patient has had 2 COVID vaccines.   Social Determinants of Health Food Insecurity: denies food insecurity Transportation: Patient denies transportation needs. Childcare: Discussed no children allowed at ultrasound appointments.   First visit review I reviewed new OB appt with pt. I explained she will have  blood work and pap smear/pelvic exam if indicated. Explained pt will be seen by Oley Balm, CNM at first visit; encounter routed to appropriate provider.   Donnetta Hail, [facility] 10/31/2023  2:51 PM

## 2023-10-31 ENCOUNTER — Ambulatory Visit (INDEPENDENT_AMBULATORY_CARE_PROVIDER_SITE_OTHER)

## 2023-10-31 DIAGNOSIS — Z3481 Encounter for supervision of other normal pregnancy, first trimester: Secondary | ICD-10-CM

## 2023-10-31 DIAGNOSIS — Z3480 Encounter for supervision of other normal pregnancy, unspecified trimester: Secondary | ICD-10-CM | POA: Insufficient documentation

## 2023-10-31 DIAGNOSIS — Z3A01 Less than 8 weeks gestation of pregnancy: Secondary | ICD-10-CM

## 2023-10-31 NOTE — Patient Instructions (Signed)
 First Trimester of Pregnancy  The first trimester of pregnancy starts on the first day of your last monthly period until the end of week 13. This is months 1 through 3 of pregnancy. A week after a sperm fertilizes an egg, the egg will implant into the wall of the uterus and begin to develop into a baby. Body changes during your first trimester Your body goes through many changes during pregnancy. The changes usually return to normal after your baby is born. Physical changes Your breasts may grow larger and may hurt. The area around your nipples may get darker. Your periods will stop. Your hair and nails may grow faster. You may pee more often. Health changes You may tire easily. Your gums may bleed and may be sensitive when you brush and floss. You may not feel hungry. You may have heartburn. You may throw up or feel like you may throw up. You may want to eat some foods, but not others. You may have headaches. You may have trouble pooping (constipation). Other changes Your emotions may change from day to day. You may have more dreams. Follow these instructions at home: Medicines Talk to your health care provider if you're taking medicines. Ask if the medicines are safe to take during pregnancy. Your provider may change the medicines that you take. Do not take any medicines unless told to by your provider. Take a prenatal vitamin that has at least 600 micrograms (mcg) of folic acid. Do not use herbal medicines, illegal substances, or medicines that are not approved by your provider. Eating and drinking While you're pregnant your body needs extra food for your growing baby. Talk with your provider about what to eat while pregnant. Activity Most women are able to exercise during pregnancy. Exercises may need to change as your pregnancy goes on. Talk to your provider about your activities and exercise routines. Relieving pain and discomfort Wear a good, supportive bra if your breasts  hurt. Rest with your legs raised if you have leg cramps or low back pain. Safety Wear your seatbelt at all times when you're in a car. Talk to your provider if someone hits you, hurts you, or yells at you. Talk with your provider if you're feeling sad or have thoughts of hurting yourself. Lifestyle Certain things can be harmful while you're pregnant. Follow these rules: Do not use hot tubs, steam rooms, or saunas. Do not douche. Do not use tampons or scented pads. Do not drink alcohol,smoke, vape, or use products with nicotine or tobacco in them. If you need help quitting, talk with your provider. Avoid cat litter boxes and soil used by cats. These things carry germs that can cause harm to your pregnancy and your baby. General instructions Keep all follow-up visits. It helps you and your unborn baby stay as healthy as possible. Write down your questions. Take them to your visits. Your provider will: Talk with you about your overall health. Give you advice or refer you to specialists who can help with different needs, including: Prenatal education classes. Mental health and counseling. Foods and healthy eating. Ask for help if you need help with food. Call your dentist and ask to be seen. Brush your teeth with a soft toothbrush. Floss gently. Where to find more information American Pregnancy Association: americanpregnancy.org [employer]stetricians and Gynecologists: acog.org Office on [employer] Health: [employer] Contact a health care provider if: You feel dizzy, faint, or have a fever. You vomit or have watery poop (diarrhea) for 2  days or more. You have abnormal discharge or bleeding from your vagina. You have pain when you pee or your pee smells bad. You have cramps, pain, or pressure in your belly area. Get help right away if: You have trouble breathing or chest pain. You have any kind of injury, such as from a fall or a car crash. These symptoms may be an  emergency. Get help right away. Call 911. Do not wait to see if the symptoms will go away. Do not drive yourself to the hospital. This information is not intended to replace advice given to you by your health care provider. Make sure you discuss any questions you have with your health care provider. Document Revised: 04/17/2023 Document Reviewed: 11/15/2022 Elsevier Patient Education  2024 Elsevier Inc.Commonly Asked Questions During Pregnancy  Cats: A parasite can be excreted in cat feces.  To avoid exposure you need to have another person empty the little box.  If you must empty the litter box you will need to wear gloves.  Wash your hands after handling your cat.  This parasite can also be found in raw or undercooked meat so this should also be avoided.  Colds, Sore Throats, Flu: Please check your medication sheet to see what you can take for symptoms.  If your symptoms are unrelieved by these medications please call the office.  Dental Work: Most any dental work Agricultural consultant recommends is permitted.  X-rays should only be taken during the first trimester if absolutely necessary.  Your abdomen should be shielded with a lead apron during all x-rays.  Please notify your provider prior to receiving any x-rays.  Novocaine is fine; gas is not recommended.  If your dentist requires a note from [place] prior to dental work please call the office and we will provide one for you.  Exercise: Exercise is an important part of staying healthy during your pregnancy.  You may continue most exercises you were accustomed to prior to pregnancy.  Later in your pregnancy you will most likely notice you have difficulty with activities requiring balance like riding a bicycle.  It is important that you listen to your body and avoid activities that put you at a higher risk of falling.  Adequate rest and staying well hydrated are a must!  If you have questions about the safety of specific activities ask your provider.     Exposure to Children with illness: Try to avoid obvious exposure; report any symptoms to [place] when noted,  If you have chicken pos, red measles or mumps, you should be immune to these diseases.   Please do not take any vaccines while pregnant unless you have checked with your OB provider.  Fetal Movement: After 28 weeks we recommend you do "kick counts" twice daily.  Lie or sit down in a calm quiet environment and count your baby movements "kicks".  You should feel your baby at least 10 times per hour.  If you have not felt 10 kicks within the first hour get up, walk around and have something sweet to eat or drink then repeat for an additional hour.  If count remains less than 10 per hour notify your provider.  Fumigating: Follow your pest control agent's advice as to how long to stay out of your home.  Ventilate the area well before re-entering.  Hemorrhoids:   Most over-the-counter preparations can be used during pregnancy.  Check your medication to see what is safe to use.  It is important to use a  stool softener or fiber in your diet and to drink lots of liquids.  If hemorrhoids seem to be getting worse please call the office.   Hot Tubs:  Hot tubs Jacuzzis and saunas are not recommended while pregnant.  These increase your internal body temperature and should be avoided.  Intercourse:  Sexual intercourse is safe during pregnancy as long as you are comfortable, unless otherwise advised by your provider.  Spotting may occur after intercourse; report any bright red bleeding that is heavier than spotting.  Labor:  If you know that you are in labor, please go to the hospital.  If you are unsure, please call the office and let us help you decide what to do.  Lifting, straining, etc:  If your job requires heavy lifting or straining please check with your provider for any limitations.  Generally, you should not lift items heavier than that you can lift simply with your hands and arms (no back  muscles)  Painting:  Paint fumes do not harm your pregnancy, but may make you ill and should be avoided if possible.  Latex or water based paints have less odor than oils.  Use adequate ventilation while painting.  Permanents & Hair Color:  Chemicals in hair dyes are not recommended as they cause increase hair dryness which can increase hair loss during pregnancy.  " Highlighting" and permanents are allowed.  Dye may be absorbed differently and permanents may not hold as well during pregnancy.  Sunbathing:  Use a sunscreen, as skin burns easily during pregnancy.  Drink plenty of fluids; avoid over heating.  Tanning Beds:  Because their possible side effects are still unknown, tanning beds are not recommended.  Ultrasound Scans:  Routine ultrasounds are performed at approximately 20 weeks.  You will be able to see your baby's general anatomy an if you would like to know the gender this can usually be determined as well.  If it is questionable when you conceived you may also receive an ultrasound early in your pregnancy for dating purposes.  Otherwise ultrasound exams are not routinely performed unless there is a medical necessity.  Although you can request a scan we ask that you pay for it when conducted because insurance does not cover " patient request" scans.  Work: If your pregnancy proceeds without complications you may work until your due date, unless your physician or employer advises otherwise.  Round Ligament Pain/Pelvic Discomfort:  Sharp, shooting pains not associated with bleeding are fairly common, usually occurring in the second trimester of pregnancy.  They tend to be worse when standing up or when you remain standing for long periods of time.  These are the result of pressure of certain pelvic ligaments called "round ligaments".  Rest, Tylenol and heat seem to be the most effective relief.  As the womb and fetus grow, they rise out of the pelvis and the discomfort improves.  Please  notify the office if your pain seems different than that described.  It may represent a more serious condition.  Common Medications Safe in Pregnancy  Acne:      Constipation:  Benzoyl Peroxide     Colace  Clindamycin      Dulcolax Suppository  Topica Erythromycin     Fibercon  Salicylic Acid      Metamucil         Miralax AVOID:        Senakot   Accutane    Cough:  Retin-A       Cough  Drops  Tetracycline      Phenergan w/ Codeine if Rx  Minocycline      Robitussin (Plain & DM)  Antibiotics:     Crabs/Lice:  Ceclor       RID  Cephalosporins    AVOID:  E-Mycins      Kwell  Keflex  Macrobid/Macrodantin   Diarrhea:  Penicillin      Kao-Pectate  Zithromax      Imodium AD         PUSH FLUIDS AVOID:       Cipro     Fever:  Tetracycline      Tylenol (Regular or Extra  Minocycline       Strength)  Levaquin      Extra Strength-Do not          Exceed 8 tabs/24 hrs Caffeine:        200mg /day (equiv. To 1 cup of coffee or  approx. 3 12 oz sodas)         Gas: Cold/Hayfever:       Gas-X  Benadryl      Mylicon  Claritin       Phazyme  **Claritin-D        Chlor-Trimeton    Headaches:  Dimetapp      ASA-Free Excedrin  Drixoral-Non-Drowsy     Cold Compress  Mucinex (Guaifenasin)     Tylenol (Regular or Extra  Sudafed/Sudafed-12 Hour     Strength)  **Sudafed PE Pseudoephedrine   Tylenol Cold & Sinus     Vicks Vapor Rub  Zyrtec  **AVOID if Problems With Blood Pressure         Heartburn: Avoid lying down for at least 1 hour after meals  Aciphex      Maalox     Rash:  Milk of Magnesia     Benadryl    Mylanta       1% Hydrocortisone Cream  Pepcid  Pepcid Complete   Sleep Aids:  Prevacid      Ambien   Prilosec       Benadryl  Rolaids       Chamomile Tea  Tums (Limit 4/day)     Unisom         Tylenol PM         Warm milk-add vanilla or  Hemorrhoids:       Sugar for taste  Anusol/Anusol H.C.  (RX: Analapram 2.5%)  Sugar Substitutes:  Hydrocortisone OTC     Ok in  moderation  Preparation H      Tucks        Vaseline lotion applied to tissue with wiping    Herpes:     Throat:  Acyclovir      Oragel  Famvir  Valtrex     Vaccines:         Flu Shot Leg Cramps:       *Gardasil  Benadryl      Hepatitis A         Hepatitis B Nasal Spray:       Pneumovax  Saline Nasal Spray     Polio Booster         Tetanus Nausea:       Tuberculosis test or PPD  Vitamin B6 25 mg TID   AVOID:    Dramamine      *Gardasil  Emetrol       Live Poliovirus  Ginger Root 250 mg QID    MMR (measles, mumps &  High  Complex Carbs @ Bedtime    rebella)  Sea Bands-Accupressure    Varicella (Chickenpox)  Unisom 1/2 tab TID     *No known complications           If received before Pain:         Known pregnancy;   Darvocet       Resume series after  Lortab        Delivery  Percocet    Yeast:   Tramadol      Femstat  Tylenol 3      Gyne-lotrimin  Ultram       Monistat  Vicodin           MISC:         All Sunscreens           Hair Coloring/highlights          Insect Repellant's          (Including DEET)         Mystic Tans

## 2023-11-03 NOTE — ED Provider Notes (Signed)
 [facility] Provider Note    Event Date/Time   First MD Initiated Contact with Patient 10/22/23 1949     (approximate)   History   Abdominal Pain   HPI  Felicia Scott is a 28 y.o. female who reports she is proximately [redacted] weeks pregnant, sent in for ultrasound [facility] because of some pelvic cramping.  She reports this is mostly improved.  Does report some minimal spotting earlier today, now resolved.  Review of records demonstrate the patient is Rh+     Physical Exam   Triage Vital Signs: ED Triage Vitals [10/22/23 1758]  Encounter Vitals Group     BP 116/67     Systolic BP Percentile      Diastolic BP Percentile      Pulse Rate 71     Resp 16     Temp 98.9 F (37.2 C)     Temp Source Oral     SpO2 100 %     Weight 82.6 kg (182 lb)     Height 1.626 m (5\' 4" )     Head Circumference      Peak Flow      Pain Score 8     Pain Loc      Pain Education      Exclude from Growth Chart     Most recent vital signs: Vitals:   10/22/23 1758  BP: 116/67  Pulse: 71  Resp: 16  Temp: 98.9 F (37.2 C)  SpO2: 100%     General: Awake, no distress.  CV:  Good peripheral perfusion.  Resp:  Normal effort.  Abd:  No distention.  Soft, nontender, reassuring exam Other:     ED Results / Procedures / Treatments   Labs (all labs ordered are listed, but only abnormal results are displayed) Labs Reviewed  URINALYSIS, ROUTINE W REFLEX MICROSCOPIC - Abnormal; Notable for the following components:      Result Value   Color, Urine YELLOW (*)    APPearance HAZY (*)    Specific Gravity, Urine 1.032 (*)    All other components within normal limits  COMPREHENSIVE METABOLIC PANEL - Abnormal; Notable for the following components:   Sodium 134 (*)    All other components within normal limits  HCG, QUANTITATIVE, PREGNANCY - Abnormal; Notable for the following components:   hCG, Beta Chain, Quant, S 170,148 (*)    All other components  within normal limits  POC URINE PREG, ED - Abnormal; Notable for the following components:   Preg Test, Ur Positive (*)    All other components within normal limits  CBC WITH DIFFERENTIAL/PLATELET     EKG     RADIOLOGY Ultrasound demonstrates IUP    PROCEDURES:  Critical Care performed:   Procedures   MEDICATIONS ORDERED IN ED: Medications - No data to display   IMPRESSION / MDM / ASSESSMENT AND PLAN / ED COURSE  I reviewed the triage vital signs and the nursing notes. Patient's presentation is most consistent with acute presentation with potential threat to life or bodily function.  Patient presents with pelvic pain pregnancy, differential includes ectopic, subchorionic hemorrhage, nonspecific pain of pregnancy, UTI  Lab work reviewed and overall reassuring, urinalysis is unremarkable.  Beta of 170,000, Rh+  Ultrasound is reassuring, IUP demonstrated.  Discussed with patient she would like to forego further workup at this time, will treat with Tylenol, follow-up with her doctor she knows to return if any worsening anytime  FINAL CLINICAL IMPRESSION(S) / ED DIAGNOSES   Final diagnoses:  Abdominal pain in pregnancy, first trimester     Rx / DC Orders   ED Discharge Orders     None        Note:  This document was prepared using Dragon voice recognition software and may include unintentional dictation errors.   Jene Every, MD 11/03/23 [phone]

## 2023-11-08 DIAGNOSIS — Z419 Encounter for procedure for purposes other than remedying health state, unspecified: Secondary | ICD-10-CM | POA: Diagnosis not present

## 2023-11-17 ENCOUNTER — Other Ambulatory Visit: Payer: Self-pay | Admitting: Certified Nurse Midwife

## 2023-11-17 ENCOUNTER — Ambulatory Visit (INDEPENDENT_AMBULATORY_CARE_PROVIDER_SITE_OTHER)

## 2023-11-17 DIAGNOSIS — O3680X0 Pregnancy with inconclusive fetal viability, not applicable or unspecified: Secondary | ICD-10-CM

## 2023-11-17 DIAGNOSIS — Z3A11 11 weeks gestation of pregnancy: Secondary | ICD-10-CM | POA: Diagnosis not present

## 2023-11-19 ENCOUNTER — Ambulatory Visit (INDEPENDENT_AMBULATORY_CARE_PROVIDER_SITE_OTHER): Admitting: Certified Nurse Midwife

## 2023-11-19 ENCOUNTER — Other Ambulatory Visit (HOSPITAL_COMMUNITY)
Admission: RE | Admit: 2023-11-19 | Discharge: 2023-11-19 | Disposition: A | Discharge status: Home / Self Care | Source: Ambulatory Visit | Attending: Certified Nurse Midwife | Admitting: Certified Nurse Midwife

## 2023-11-19 ENCOUNTER — Encounter: Payer: Self-pay | Admitting: Certified Nurse Midwife

## 2023-11-19 VITALS — BP 113/57 | HR 79 | Wt 183.1 lb

## 2023-11-19 DIAGNOSIS — Z113 Encounter for screening for infections with a predominantly sexual mode of transmission: Secondary | ICD-10-CM | POA: Diagnosis present

## 2023-11-19 DIAGNOSIS — B9689 Other specified bacterial agents as the cause of diseases classified elsewhere: Secondary | ICD-10-CM | POA: Insufficient documentation

## 2023-11-19 DIAGNOSIS — Z3481 Encounter for supervision of other normal pregnancy, first trimester: Secondary | ICD-10-CM | POA: Diagnosis not present

## 2023-11-19 DIAGNOSIS — Z0283 Encounter for blood-alcohol and blood-drug test: Secondary | ICD-10-CM

## 2023-11-19 DIAGNOSIS — O99211 Obesity complicating pregnancy, first trimester: Secondary | ICD-10-CM

## 2023-11-19 DIAGNOSIS — Z1379 Encounter for other screening for genetic and chromosomal anomalies: Secondary | ICD-10-CM

## 2023-11-19 DIAGNOSIS — Z3480 Encounter for supervision of other normal pregnancy, unspecified trimester: Secondary | ICD-10-CM | POA: Diagnosis not present

## 2023-11-19 DIAGNOSIS — Z3A11 11 weeks gestation of pregnancy: Secondary | ICD-10-CM

## 2023-11-19 DIAGNOSIS — B3731 Acute candidiasis of vulva and vagina: Secondary | ICD-10-CM | POA: Insufficient documentation

## 2023-11-19 DIAGNOSIS — O98811 Other maternal infectious and parasitic diseases complicating pregnancy, first trimester: Secondary | ICD-10-CM | POA: Insufficient documentation
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
## 2023-11-20 LAB — URINALYSIS, ROUTINE W REFLEX MICROSCOPIC
Bilirubin, UA: NEGATIVE
Glucose, UA: NEGATIVE
Leukocytes,UA: NEGATIVE
Nitrite, UA: NEGATIVE
RBC, UA: NEGATIVE
Specific Gravity, UA: >=1.03 — AB (ref 1.005–1.030)
Urobilinogen, Ur: 0.2 mg/dL (ref 0.2–1.0)
pH, UA: 5.5 (ref 5.0–7.5)

## 2023-11-20 LAB — CBC/D/PLT+RPR+RH+ABO+RUBIGG...
Antibody Screen: NEGATIVE
Basophils Absolute: 0 10*3/uL (ref 0.0–0.2)
Basos: 1 %
EOS (ABSOLUTE): 0.1 10*3/uL (ref 0.0–0.4)
Eos: 2 %
HCV Ab: NONREACTIVE
HIV Screen 4th Generation wRfx: NONREACTIVE
Hematocrit: 39.9 % (ref 34.0–46.6)
Hemoglobin: 13 g/dL (ref 11.1–15.9)
Hepatitis B Surface Ag: NEGATIVE
Immature Grans (Abs): 0 10*3/uL (ref 0.0–0.1)
Immature Granulocytes: 0 %
Lymphocytes Absolute: 1.8 10*3/uL (ref 0.7–3.1)
Lymphs: 31 %
MCH: 29.4 pg (ref 26.6–33.0)
MCHC: 32.6 g/dL (ref 31.5–35.7)
MCV: 90 fL (ref 79–97)
Monocytes Absolute: 0.5 10*3/uL (ref 0.1–0.9)
Monocytes: 8 %
Neutrophils Absolute: 3.2 10*3/uL (ref 1.4–7.0)
Neutrophils: 58 %
Platelets: 282 10*3/uL (ref 150–450)
RBC: 4.42 x10E6/uL (ref 3.77–5.28)
RDW: 13.4 % (ref 11.7–15.4)
RPR Ser Ql: NONREACTIVE
Rh Factor: POSITIVE
Rubella Antibodies, IGG: <0.9 {index} — ABNORMAL LOW (ref >0.99)
Varicella zoster IgG: REACTIVE
WBC: 5.7 10*3/uL (ref 3.4–10.8)

## 2023-11-20 LAB — COMPREHENSIVE METABOLIC PANEL WITH GFR
ALT: 12 IU/L (ref 0–32)
AST: 17 IU/L (ref 0–40)
Albumin: 4.2 g/dL (ref 4.0–5.0)
Alkaline Phosphatase: 63 IU/L (ref 44–121)
BUN/Creatinine Ratio: 15 (ref 9–23)
BUN: 8 mg/dL (ref 6–20)
Bilirubin Total: 0.3 mg/dL (ref 0.0–1.2)
CO2: 18 mmol/L — ABNORMAL LOW (ref 20–29)
Calcium: 9.7 mg/dL (ref 8.7–10.2)
Chloride: 102 mmol/L (ref 96–106)
Creatinine, Ser: 0.54 mg/dL — ABNORMAL LOW (ref 0.57–1.00)
Globulin, Total: 2.8 g/dL (ref 1.5–4.5)
Glucose: 72 mg/dL (ref 70–99)
Potassium: 4.3 mmol/L (ref 3.5–5.2)
Sodium: 136 mmol/L (ref 134–144)
Total Protein: 7 g/dL (ref 6.0–8.5)
eGFR: 131 mL/min/{1.73_m2} (ref >59)

## 2023-11-20 LAB — HEMOGLOBIN A1C
Est. average glucose Bld gHb Est-mCnc: 100 mg/dL
Hgb A1c MFr Bld: 5.1 % (ref 4.8–5.6)

## 2023-11-20 LAB — TSH+FREE T4
Free T4: 1.11 ng/dL (ref 0.82–1.77)
TSH: 1.16 u[IU]/mL (ref 0.450–4.500)

## 2023-11-20 LAB — HCV INTERPRETATION

## 2023-11-21 ENCOUNTER — Other Ambulatory Visit: Payer: Self-pay | Admitting: Certified Nurse Midwife

## 2023-11-21 LAB — CERVICOVAGINAL ANCILLARY ONLY
Bacterial Vaginitis (gardnerella): POSITIVE — AB
Candida Glabrata: NEGATIVE
Candida Vaginitis: POSITIVE — AB
Comment: NEGATIVE
Comment: NEGATIVE
Comment: NEGATIVE

## 2023-11-21 MED ORDER — METRONIDAZOLE 500 MG PO TABS: 500.0000 mg | ORAL_TABLET | Freq: Two times a day (BID) | ORAL | 0 refills | Status: AC

## 2023-11-23 LAB — MATERNIT 21 PLUS CORE, BLOOD
Fetal Fraction: 18
Result (T21): NEGATIVE
Trisomy 13 (Patau syndrome): NEGATIVE
Trisomy 18 (Edwards syndrome): NEGATIVE
Trisomy 21 (Down syndrome): NEGATIVE

## 2023-11-23 LAB — MONITOR DRUG PROFILE 14(MW)
Amphetamine Scrn, Ur: NEGATIVE ng/mL
BARBITURATE SCREEN URINE: NEGATIVE ng/mL
BENZODIAZEPINE SCREEN, URINE: NEGATIVE ng/mL
Buprenorphine, Urine: NEGATIVE ng/mL
Cocaine (Metab) Scrn, Ur: NEGATIVE ng/mL
Creatinine(Crt), U: 274.5 mg/dL (ref 20.0–300.0)
Fentanyl, Urine: NEGATIVE pg/mL
Meperidine Screen, Urine: NEGATIVE ng/mL
Methadone Screen, Urine: NEGATIVE ng/mL
OXYCODONE+OXYMORPHONE UR QL SCN: NEGATIVE ng/mL
Opiate Scrn, Ur: NEGATIVE ng/mL
Ph of Urine: 5.5 (ref 4.5–8.9)
Phencyclidine Qn, Ur: NEGATIVE ng/mL
Propoxyphene Scrn, Ur: NEGATIVE ng/mL
SPECIFIC GRAVITY: 1.032
Tramadol Screen, Urine: NEGATIVE ng/mL

## 2023-11-23 LAB — CANNABINOID (GC/MS), URINE
Cannabinoid: POSITIVE — AB
Carboxy THC (GC/MS): 78 ng/mL

## 2023-11-24 DIAGNOSIS — F331 Major depressive disorder, recurrent, moderate: Secondary | ICD-10-CM | POA: Diagnosis not present

## 2023-11-24 DIAGNOSIS — F411 Generalized anxiety disorder: Secondary | ICD-10-CM | POA: Diagnosis not present

## 2023-12-09 DIAGNOSIS — F411 Generalized anxiety disorder: Secondary | ICD-10-CM | POA: Diagnosis not present

## 2023-12-17 ENCOUNTER — Encounter: Admitting: Obstetrics

## 2023-12-17 ENCOUNTER — Telehealth: Payer: Self-pay | Admitting: Obstetrics

## 2023-12-17 NOTE — Telephone Encounter (Signed)
 Reached out to pt to reschedule ROB appt that was scheduled on 12/17/2023 at 8:15 with Cinda Craze.  Left message for pt to call back.

## 2023-12-18 ENCOUNTER — Encounter: Payer: Self-pay | Admitting: Obstetrics

## 2023-12-18 NOTE — Telephone Encounter (Signed)
 Reached out to pt (2x) to reschedule ROB appt that was scheduled on 12/17/2023 at 8:15 with Cinda Craze. Left message for pt to call back.  Will send a MyChart letter to pt.

## 2024-01-14 DIAGNOSIS — Z3689 Encounter for other specified antenatal screening: Secondary | ICD-10-CM | POA: Diagnosis not present

## 2024-01-14 DIAGNOSIS — Z3482 Encounter for supervision of other normal pregnancy, second trimester: Secondary | ICD-10-CM | POA: Diagnosis not present

## 2024-01-14 DIAGNOSIS — Z3A19 19 weeks gestation of pregnancy: Secondary | ICD-10-CM | POA: Diagnosis not present

## 2024-02-12 DIAGNOSIS — Z362 Encounter for other antenatal screening follow-up: Secondary | ICD-10-CM | POA: Diagnosis not present

## 2024-02-12 DIAGNOSIS — Z3A24 24 weeks gestation of pregnancy: Secondary | ICD-10-CM | POA: Diagnosis not present

## 2024-02-12 DIAGNOSIS — Z3493 Encounter for supervision of normal pregnancy, unspecified, third trimester: Secondary | ICD-10-CM | POA: Diagnosis not present

## 2024-02-12 DIAGNOSIS — Z3482 Encounter for supervision of other normal pregnancy, second trimester: Secondary | ICD-10-CM | POA: Diagnosis not present

## 2024-02-12 DIAGNOSIS — Z369 Encounter for antenatal screening, unspecified: Secondary | ICD-10-CM | POA: Diagnosis not present

## 2024-03-12 DIAGNOSIS — Z3483 Encounter for supervision of other normal pregnancy, third trimester: Secondary | ICD-10-CM | POA: Diagnosis not present

## 2024-03-22 DIAGNOSIS — Z1159 Encounter for screening for other viral diseases: Secondary | ICD-10-CM | POA: Diagnosis not present

## 2024-03-22 DIAGNOSIS — Z20822 Contact with and (suspected) exposure to covid-19: Secondary | ICD-10-CM | POA: Diagnosis not present

## 2024-03-22 DIAGNOSIS — Z3493 Encounter for supervision of normal pregnancy, unspecified, third trimester: Secondary | ICD-10-CM | POA: Diagnosis not present

## 2024-03-22 DIAGNOSIS — U071 COVID-19: Secondary | ICD-10-CM | POA: Diagnosis not present

## 2024-03-23 DIAGNOSIS — Z3483 Encounter for supervision of other normal pregnancy, third trimester: Secondary | ICD-10-CM | POA: Diagnosis not present

## 2024-04-07 DIAGNOSIS — Z3483 Encounter for supervision of other normal pregnancy, third trimester: Secondary | ICD-10-CM | POA: Diagnosis not present

## 2024-04-20 DIAGNOSIS — N760 Acute vaginitis: Secondary | ICD-10-CM | POA: Diagnosis not present

## 2024-04-26 DIAGNOSIS — Z3483 Encounter for supervision of other normal pregnancy, third trimester: Secondary | ICD-10-CM | POA: Diagnosis not present

## 2024-05-20 DIAGNOSIS — Z3483 Encounter for supervision of other normal pregnancy, third trimester: Secondary | ICD-10-CM | POA: Diagnosis not present

## 2024-06-03 ENCOUNTER — Inpatient Hospital Stay: Admit: 2024-06-03

## 2024-07-02 DIAGNOSIS — Z1389 Encounter for screening for other disorder: Secondary | ICD-10-CM | POA: Diagnosis not present

## 2024-07-02 DIAGNOSIS — F411 Generalized anxiety disorder: Secondary | ICD-10-CM | POA: Diagnosis not present

## 2024-07-20 DIAGNOSIS — Z124 Encounter for screening for malignant neoplasm of cervix: Secondary | ICD-10-CM | POA: Diagnosis not present
